# Patient Record
Sex: Male | Born: 1960 | Race: White | Hispanic: No | Marital: Married | Smoking: Never smoker
Health system: Southern US, Community
[De-identification: ages and names within clinical notes are randomized; demographics above are authoritative.]

## PROBLEM LIST (undated history)

## (undated) DIAGNOSIS — J45909 Unspecified asthma, uncomplicated: Secondary | ICD-10-CM

## (undated) DIAGNOSIS — E7800 Pure hypercholesterolemia, unspecified: Secondary | ICD-10-CM

## (undated) DIAGNOSIS — J302 Other seasonal allergic rhinitis: Secondary | ICD-10-CM

## (undated) HISTORY — DX: Pure hypercholesterolemia, unspecified: E78.00

## (undated) HISTORY — DX: Other seasonal allergic rhinitis: J30.2

## (undated) HISTORY — PX: VASECTOMY: SHX75

## (undated) HISTORY — DX: Unspecified asthma, uncomplicated: J45.909

---

## 2016-10-30 DIAGNOSIS — J309 Allergic rhinitis, unspecified: Secondary | ICD-10-CM | POA: Diagnosis not present

## 2016-10-30 DIAGNOSIS — J4540 Moderate persistent asthma, uncomplicated: Secondary | ICD-10-CM | POA: Diagnosis not present

## 2017-05-31 DIAGNOSIS — R194 Change in bowel habit: Secondary | ICD-10-CM | POA: Diagnosis not present

## 2017-05-31 DIAGNOSIS — K580 Irritable bowel syndrome with diarrhea: Secondary | ICD-10-CM | POA: Diagnosis not present

## 2017-05-31 DIAGNOSIS — R140 Abdominal distension (gaseous): Secondary | ICD-10-CM | POA: Diagnosis not present

## 2017-06-18 DIAGNOSIS — N400 Enlarged prostate without lower urinary tract symptoms: Secondary | ICD-10-CM | POA: Diagnosis not present

## 2017-06-18 DIAGNOSIS — Z8349 Family history of other endocrine, nutritional and metabolic diseases: Secondary | ICD-10-CM | POA: Diagnosis not present

## 2017-06-18 DIAGNOSIS — J309 Allergic rhinitis, unspecified: Secondary | ICD-10-CM | POA: Diagnosis not present

## 2017-06-18 DIAGNOSIS — Z8639 Personal history of other endocrine, nutritional and metabolic disease: Secondary | ICD-10-CM | POA: Diagnosis not present

## 2017-06-18 DIAGNOSIS — J4540 Moderate persistent asthma, uncomplicated: Secondary | ICD-10-CM | POA: Diagnosis not present

## 2017-06-18 DIAGNOSIS — Z0000 Encounter for general adult medical examination without abnormal findings: Secondary | ICD-10-CM | POA: Diagnosis not present

## 2017-06-18 DIAGNOSIS — Z8249 Family history of ischemic heart disease and other diseases of the circulatory system: Secondary | ICD-10-CM | POA: Diagnosis not present

## 2017-11-05 DIAGNOSIS — L821 Other seborrheic keratosis: Secondary | ICD-10-CM | POA: Diagnosis not present

## 2017-11-05 DIAGNOSIS — D1801 Hemangioma of skin and subcutaneous tissue: Secondary | ICD-10-CM | POA: Diagnosis not present

## 2017-11-05 DIAGNOSIS — D225 Melanocytic nevi of trunk: Secondary | ICD-10-CM | POA: Diagnosis not present

## 2018-04-16 DIAGNOSIS — K580 Irritable bowel syndrome with diarrhea: Secondary | ICD-10-CM | POA: Diagnosis not present

## 2018-04-16 DIAGNOSIS — R140 Abdominal distension (gaseous): Secondary | ICD-10-CM | POA: Diagnosis not present

## 2018-04-25 DIAGNOSIS — I10 Essential (primary) hypertension: Secondary | ICD-10-CM | POA: Diagnosis not present

## 2018-06-20 DIAGNOSIS — E785 Hyperlipidemia, unspecified: Secondary | ICD-10-CM | POA: Diagnosis not present

## 2018-06-20 DIAGNOSIS — M791 Myalgia, unspecified site: Secondary | ICD-10-CM | POA: Diagnosis not present

## 2018-06-20 DIAGNOSIS — Z0000 Encounter for general adult medical examination without abnormal findings: Secondary | ICD-10-CM | POA: Diagnosis not present

## 2018-07-11 ENCOUNTER — Other Ambulatory Visit: Payer: Self-pay | Admitting: Family Medicine

## 2018-07-11 DIAGNOSIS — M79604 Pain in right leg: Secondary | ICD-10-CM

## 2018-07-11 DIAGNOSIS — M79605 Pain in left leg: Secondary | ICD-10-CM

## 2018-07-18 ENCOUNTER — Ambulatory Visit
Admission: RE | Admit: 2018-07-18 | Discharge: 2018-07-18 | Disposition: A | Discharge status: Home / Self Care | Payer: 59 | Source: Ambulatory Visit | Attending: Family Medicine | Admitting: Family Medicine

## 2018-07-18 DIAGNOSIS — I70213 Atherosclerosis of native arteries of extremities with intermittent claudication, bilateral legs: Secondary | ICD-10-CM | POA: Diagnosis not present

## 2018-07-18 DIAGNOSIS — M79605 Pain in left leg: Secondary | ICD-10-CM

## 2018-07-18 DIAGNOSIS — M79604 Pain in right leg: Secondary | ICD-10-CM

## 2018-07-31 DIAGNOSIS — M9905 Segmental and somatic dysfunction of pelvic region: Secondary | ICD-10-CM | POA: Diagnosis not present

## 2018-07-31 DIAGNOSIS — M9902 Segmental and somatic dysfunction of thoracic region: Secondary | ICD-10-CM | POA: Diagnosis not present

## 2018-07-31 DIAGNOSIS — M9903 Segmental and somatic dysfunction of lumbar region: Secondary | ICD-10-CM | POA: Diagnosis not present

## 2018-08-08 DIAGNOSIS — M9905 Segmental and somatic dysfunction of pelvic region: Secondary | ICD-10-CM | POA: Diagnosis not present

## 2018-08-08 DIAGNOSIS — M9902 Segmental and somatic dysfunction of thoracic region: Secondary | ICD-10-CM | POA: Diagnosis not present

## 2018-08-08 DIAGNOSIS — M9903 Segmental and somatic dysfunction of lumbar region: Secondary | ICD-10-CM | POA: Diagnosis not present

## 2018-08-15 DIAGNOSIS — M9905 Segmental and somatic dysfunction of pelvic region: Secondary | ICD-10-CM | POA: Diagnosis not present

## 2018-08-15 DIAGNOSIS — M9903 Segmental and somatic dysfunction of lumbar region: Secondary | ICD-10-CM | POA: Diagnosis not present

## 2018-08-15 DIAGNOSIS — M9902 Segmental and somatic dysfunction of thoracic region: Secondary | ICD-10-CM | POA: Diagnosis not present

## 2018-09-11 ENCOUNTER — Other Ambulatory Visit: Payer: Self-pay | Admitting: Family Medicine

## 2018-09-11 DIAGNOSIS — M545 Low back pain, unspecified: Secondary | ICD-10-CM

## 2018-09-11 DIAGNOSIS — M79604 Pain in right leg: Secondary | ICD-10-CM

## 2018-09-11 DIAGNOSIS — M79605 Pain in left leg: Secondary | ICD-10-CM

## 2018-09-17 ENCOUNTER — Ambulatory Visit
Admission: RE | Admit: 2018-09-17 | Discharge: 2018-09-17 | Disposition: A | Discharge status: Home / Self Care | Payer: 59 | Source: Ambulatory Visit | Attending: Family Medicine | Admitting: Family Medicine

## 2018-09-17 DIAGNOSIS — M79605 Pain in left leg: Secondary | ICD-10-CM

## 2018-09-17 DIAGNOSIS — M79604 Pain in right leg: Secondary | ICD-10-CM

## 2018-09-17 DIAGNOSIS — M545 Low back pain, unspecified: Secondary | ICD-10-CM

## 2018-09-17 DIAGNOSIS — M48061 Spinal stenosis, lumbar region without neurogenic claudication: Secondary | ICD-10-CM | POA: Diagnosis not present

## 2018-09-30 DIAGNOSIS — Z6826 Body mass index (BMI) 26.0-26.9, adult: Secondary | ICD-10-CM | POA: Diagnosis not present

## 2018-09-30 DIAGNOSIS — M48061 Spinal stenosis, lumbar region without neurogenic claudication: Secondary | ICD-10-CM | POA: Diagnosis not present

## 2019-07-03 ENCOUNTER — Encounter: Payer: Self-pay | Admitting: Rheumatology

## 2019-12-10 NOTE — Progress Notes (Signed)
Office Visit Note  Patient: Joseph Davila             Date of Birth: 1961-02-01           MRN: [id number]             PCP: Kristen Loader, FNP Referring: Kristen Loader, FNP Visit Date: 12/12/2019 Occupation: @GUAROCC @  Subjective:  Pain in lower back  History of Present Illness: Zohaib Heeney is a 59 y.o. male seen in consultation per request of Dr. Collene Mares.  According to patient in 2017 he developed a Giardia infection and was treated with antibiotics.  Although the diarrhea never resolved.  He was seen by Dr. Collene Mares and had colonoscopy.  She also treated him with different antibiotics.  He was eventually diagnosed with irritable bowel syndrome.  He has had diarrhea for last many years.  He states he usually does train and hikes.  In 2018 he was training and climbed [place].  In the fall 2018 till July 2019 he did heavy training for the .  He states he felt stronger and did a steep climb with 65 pound pack on his back.  He states he had to quit in the middle as he felt weaker and had worsening of his GI symptoms.  In 2020 he took it easy and recovered.  He also had improvement in his GI symptoms with increased fiber intake.  Since 2019 he has been experiencing increased lower back pain and calf pain and lower extremity pain.  He states he had MRI of his lumbar spine which showed some changes and he was seen by neurosurgeon who evaluated him and explained to him that he does not require surgery and nothing else will be helpful.  He states he continues to have lower back pain and lower extremity pain.  He states he can walk for few miles but after that it is difficult for him.  He starts having discomfort in his bilateral gluteal region and his bilateral hamstrings.  He occasionally experiences some paresthesias.  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  He states prolonged sitting also causes increased lower back pain.  He has got a chair now with a good lumbar support.  He has been also  doing some stretching exercises which has been helpful.  He still has to take 3 tablets of Advil every morning and at night.  None of the other joints are painful.  He states he has some stiffness in his knee joints at times.  He denies any history of joint swelling.  Activities of Daily Living:  Patient reports morning stiffness for 30 minutes.   Patient Denies nocturnal pain.  Difficulty dressing/grooming: Reports Difficulty climbing stairs: Denies Difficulty getting out of chair: Denies Difficulty using hands for taps, buttons, cutlery, and/or writing: Denies  Review of Systems  Constitutional: Positive for fatigue. Negative for night sweats.  HENT: Negative for mouth sores, mouth dryness and nose dryness.   Eyes: Negative for redness and dryness.  Respiratory: Negative for shortness of breath and difficulty breathing.   Cardiovascular: Negative for chest pain, palpitations, hypertension, irregular heartbeat and swelling in legs/feet.  Gastrointestinal: Positive for diarrhea. Negative for constipation.  Endocrine: Negative for excessive thirst and increased urination.  Genitourinary: Negative for difficulty urinating.  Musculoskeletal: Positive for arthralgias, joint pain and morning stiffness. Negative for joint swelling, myalgias, muscle weakness, muscle tenderness and myalgias.  Skin: Negative for color change, rash, hair loss, nodules/bumps, skin  tightness, ulcers and sensitivity to sunlight.  Allergic/Immunologic: Negative for susceptible to infections.  Neurological: Positive for numbness and weakness. Negative for dizziness, fainting, memory loss and night sweats.  Hematological: Negative for bruising/bleeding tendency and swollen glands.  Psychiatric/Behavioral: Negative for depressed mood and sleep disturbance. The patient is not nervous/anxious.     PMFS History:  There are no problems to display for this patient.   Past Medical History:  Diagnosis Date  . Asthma   .  High cholesterol   . Seasonal allergies     Family History  Problem Relation Age of Onset  . Dementia Mother   . Cancer Father    Past Surgical History:  Procedure Laterality Date  . VASECTOMY     Social History   Social History Narrative  . Not on file    There is no immunization history on file for this patient.   Objective: Vital Signs: BP (!) 141/93 (BP Location: Right Arm, Patient Position: Sitting, Cuff Size: Normal)   Pulse 74   Resp 14   Ht 5' 10"  (1.778 m)   Wt 188 lb (85.3 kg)   BMI 26.98 kg/m    Physical Exam Vitals and nursing note reviewed.  Constitutional:      Appearance: He is well-developed.  HENT:     Head: Normocephalic and atraumatic.  Eyes:     Conjunctiva/sclera: Conjunctivae normal.     Pupils: Pupils are equal, round, and reactive to light.  Cardiovascular:     Rate and Rhythm: Normal rate and regular rhythm.     Heart sounds: Normal heart sounds.  Pulmonary:     Effort: Pulmonary effort is normal.     Breath sounds: Normal breath sounds.  Abdominal:     General: Bowel sounds are normal.     Palpations: Abdomen is soft.  Musculoskeletal:     Cervical back: Normal range of motion and neck supple.  Skin:    General: Skin is warm and dry.     Capillary Refill: Capillary refill takes less than 2 seconds.  Neurological:     Mental Status: He is alert and oriented to person, place, and time.  Psychiatric:        Behavior: Behavior normal.      Musculoskeletal Exam: C-spine was in full range of motion.  Thoracic and lumbar spine were in full range of motion.  He had no point tenderness over the lumbar region.  No SI joint tenderness was noted.  Shoulder joints, elbow joints, wrist joints with good range of motion.  He had mild PIP and DIP thickening.  Hip joints, knee joints, ankles, MTPs and PIPs with good range of motion.  He had no plantar fasciitis or Achilles tendinitis.  CDAI Exam: CDAI Score: -- Patient Global: --; Provider  Global: -- Swollen: --; Tender: -- Joint Exam 12/12/2019   No joint exam has been documented for this visit   There is currently no information documented on the homunculus. Go to the Rheumatology activity and complete the homunculus joint exam.  Investigation: No additional findings.  Imaging: No results found.  Recent Labs: No results found for: WBC, HGB, PLT, NA, K, CL, CO2, GLUCOSE, BUN, CREATININE, BILITOT, ALKPHOS, AST, ALT, PROT, ALBUMIN, CALCIUM, GFRAA, QFTBGOLD, QFTBGOLDPLUS   July 03, 2019 vitamin D 41.1 CPK 262 (52-200) RF 25.1 (0-13.9), ANA negative, CRP negative, ESR 8, CBC normal, LDL 140, CMP normal, PSA normal June 21, 2018 RF 44  IMPRESSION: Small left-sided disc protrusion L3-4  Grade 1  anterolisthesis L4-5 with disc and facet degeneration causing moderate spinal stenosis. Broad-based left-sided disc protrusion. Moderate subarticular foraminal stenosis bilaterally  Central and right-sided disc protrusion L5-S1 with expected impingement right S1 nerve root.   Electronically Signed   By: Franchot Gallo M.D.   On: 09/18/2018 09:10  Speciality Comments: No specialty comments available.  Procedures:  No procedures performed Allergies: Patient has no allergy information on record.   Assessment / Plan:     Visit Diagnoses: Spinal stenosis of lumbar region with neurogenic claudication -his pain is mostly in his lumbar spine which radiates into his bilateral lower extremities mostly in the gluteal and hamstrings.  I reviewed MRI findings with him at length.  He has multilevel disc disease, anterolisthesis and moderate to spinal stenosis.  Core muscle strengthening exercises were demonstrated in the office and handout was given.  He feels benefit from epidural injection to relieve some of the discomfort.  We had detailed discussion regarding it.  He was in agreement.  I will refer him to Dr. Ernestina Patches for evaluation and treatment.  We also discussed natural  anti-inflammatories instead of Advil as he has GI symptoms.  Plan: Ambulatory referral to Physical Medicine Rehab  Rheumatoid factor positive-he has positive rheumatoid factor although he does not have any synovitis on examination.  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  Giardiasis - 2017.  Patient was treated with antibiotics at the time and also at a later date.  He continues to have some chronic intermittent diarrhea which may be due to underlying IBS.  He has no clinical features of reactive arthritis.  History of IBS-followed and treated by Dr. Collene Mares.  Dyslipidemia-he is on WelChol.  History of asthma  Seasonal allergies  Orders: Orders Placed This Encounter  Procedures  . Ambulatory referral to Physical Medicine Rehab   No orders of the defined types were placed in this encounter.   Face-to-face time spent with patient was 60 minutes. Greater than 50% of time was spent in counseling and coordination of care.  Follow-Up Instructions: Return in about 6 months (around 06/13/2020).   Bo Merino, MD  Note - This record has been created using Editor, commissioning.  Chart creation errors have been sought, but may not always  have been located. Such creation errors do not reflect on  the standard of medical care.

## 2019-12-11 ENCOUNTER — Telehealth: Payer: Self-pay | Admitting: Rheumatology

## 2019-12-11 NOTE — Telephone Encounter (Signed)
Patient called stating he was returning your call.  Patient states he will email his labwork results this afternoon to the email you provided.  Patient requested a return call to let him know you were able to print them out for Dr. Estanislado Pandy.

## 2019-12-12 ENCOUNTER — Ambulatory Visit (INDEPENDENT_AMBULATORY_CARE_PROVIDER_SITE_OTHER): Payer: 59 | Admitting: Rheumatology

## 2019-12-12 ENCOUNTER — Encounter: Payer: Self-pay | Admitting: Rheumatology

## 2019-12-12 ENCOUNTER — Other Ambulatory Visit: Payer: Self-pay

## 2019-12-12 VITALS — BP 141/93 | HR 74 | Resp 14 | Ht 70.0 in | Wt 188.0 lb

## 2019-12-12 DIAGNOSIS — R768 Other specified abnormal immunological findings in serum: Secondary | ICD-10-CM

## 2019-12-12 DIAGNOSIS — M48062 Spinal stenosis, lumbar region with neurogenic claudication: Secondary | ICD-10-CM | POA: Diagnosis not present

## 2019-12-12 DIAGNOSIS — A071 Giardiasis [lambliasis]: Secondary | ICD-10-CM | POA: Diagnosis not present

## 2019-12-12 DIAGNOSIS — E785 Hyperlipidemia, unspecified: Secondary | ICD-10-CM

## 2019-12-12 DIAGNOSIS — J302 Other seasonal allergic rhinitis: Secondary | ICD-10-CM

## 2019-12-12 DIAGNOSIS — Z8709 Personal history of other diseases of the respiratory system: Secondary | ICD-10-CM | POA: Insufficient documentation

## 2019-12-12 DIAGNOSIS — Z8719 Personal history of other diseases of the digestive system: Secondary | ICD-10-CM

## 2019-12-12 NOTE — Telephone Encounter (Signed)
Patient will bring labs at appointment

## 2019-12-12 NOTE — Patient Instructions (Signed)

## 2020-01-22 ENCOUNTER — Encounter: Payer: Self-pay | Admitting: Physical Medicine and Rehabilitation

## 2020-01-22 ENCOUNTER — Other Ambulatory Visit: Payer: Self-pay

## 2020-01-22 ENCOUNTER — Ambulatory Visit (INDEPENDENT_AMBULATORY_CARE_PROVIDER_SITE_OTHER): Payer: 59 | Admitting: Physical Medicine and Rehabilitation

## 2020-01-22 ENCOUNTER — Ambulatory Visit: Payer: Self-pay

## 2020-01-22 VITALS — BP 160/100 | HR 75

## 2020-01-22 DIAGNOSIS — M48061 Spinal stenosis, lumbar region without neurogenic claudication: Secondary | ICD-10-CM | POA: Diagnosis not present

## 2020-01-22 DIAGNOSIS — M48062 Spinal stenosis, lumbar region with neurogenic claudication: Secondary | ICD-10-CM | POA: Diagnosis not present

## 2020-01-22 DIAGNOSIS — M4316 Spondylolisthesis, lumbar region: Secondary | ICD-10-CM

## 2020-01-22 DIAGNOSIS — M47816 Spondylosis without myelopathy or radiculopathy, lumbar region: Secondary | ICD-10-CM | POA: Diagnosis not present

## 2020-01-22 MED ORDER — METHYLPREDNISOLONE ACETATE 80 MG/ML IJ SUSP
40.0000 mg | Freq: Once | INTRAMUSCULAR | Status: AC
  Administered 2020-01-22: 40 mg

## 2020-01-22 NOTE — Progress Notes (Signed)
Numeric Pain Rating Scale and Functional Assessment Average Pain (7) Pain Right Now (2)-took advil this morning My pain is down both legs Pain is worse with: sitting and stress Pain improves with: movement, advil and [place] HEP   In the last MONTH (on 0-10 scale) has pain interfered with the following?  1. General activity like being  able to carry out your everyday physical activities such as walking, climbing stairs, carrying groceries, or moving a chair?  Rating(6)  2. Relation with others like being able to carry out your usual social activities and roles such as  activities at home, at work and in your community. Rating(5)  3. Enjoyment of life such that you have  been bothered by emotional problems such as feeling anxious, depressed or irritable?  Rating(5)

## 2020-02-06 ENCOUNTER — Telehealth: Payer: Self-pay | Admitting: Physical Medicine and Rehabilitation

## 2020-02-06 NOTE — Telephone Encounter (Signed)
Patient had bilateral L4 TF on 4/22. He reports that he did have good relief for a little over a week, but now the pain is returning. He states that he did go several days without having to take anything for pain, but now by the end of the day is is really hurting. He also asked if he should be taking ibuprofen more regularly rather than waiting for the pain to intensify. Please advise.

## 2020-02-06 NOTE — Telephone Encounter (Signed)
Scheduled for 6/3 with driver.

## 2020-02-06 NOTE — Telephone Encounter (Signed)
Repeat injection and can use 600 to 800 mg Ibuprofen TID for short periods. SO, take ibuprofen regularly and get second shot and take ibuprofen for like two weeks then hopefull will just be as needed.

## 2020-03-04 ENCOUNTER — Encounter: Payer: Self-pay | Admitting: Physical Medicine and Rehabilitation

## 2020-03-04 ENCOUNTER — Ambulatory Visit: Payer: Self-pay

## 2020-03-04 ENCOUNTER — Ambulatory Visit (INDEPENDENT_AMBULATORY_CARE_PROVIDER_SITE_OTHER): Payer: 59 | Admitting: Physical Medicine and Rehabilitation

## 2020-03-04 ENCOUNTER — Other Ambulatory Visit: Payer: Self-pay

## 2020-03-04 VITALS — BP 163/108 | HR 62

## 2020-03-04 DIAGNOSIS — M48062 Spinal stenosis, lumbar region with neurogenic claudication: Secondary | ICD-10-CM | POA: Diagnosis not present

## 2020-03-04 DIAGNOSIS — G8929 Other chronic pain: Secondary | ICD-10-CM

## 2020-03-04 DIAGNOSIS — M5416 Radiculopathy, lumbar region: Secondary | ICD-10-CM

## 2020-03-04 MED ORDER — METHYLPREDNISOLONE ACETATE 80 MG/ML IJ SUSP
80.0000 mg | Freq: Once | INTRAMUSCULAR | Status: AC
  Administered 2020-03-04: 80 mg

## 2020-03-04 NOTE — Progress Notes (Signed)
Pt states pain across the lower back that radiates into the back of both legs all the way down. Pt states last injection 01/28/20 helped out a lot. Not being mobile makes pain worse. Moving around and advil helps with pain.   .Numeric Pain Rating Scale and Functional Assessment Average Pain 6   In the last MONTH (on 0-10 scale) has pain interfered with the following?  1. General activity like being  able to carry out your everyday physical activities such as walking, climbing stairs, carrying groceries, or moving a chair?  Rating(7)   +Driver, -BT, -Dye Allergies.

## 2020-03-04 NOTE — Progress Notes (Signed)
Joseph Davila - 59 y.o. male MRN [id number]  Date of birth: 1960-10-19  Office Visit Note: Visit Date: 03/04/2020 PCP: Kristen Loader, FNP Referred by: Kristen Loader, FNP  Subjective: Chief Complaint  Patient presents with  . Lower Back - Pain  . Right Leg - Pain  . Left Leg - Pain   HPI:  Joseph Davila is a 59 y.o. male who comes in today for planned repeat bilateral L4-L5 lumbar transforaminal epidural steroid injection with fluoroscopic guidance.  The patient has failed conservative care including home exercise, medications, time and activity modification.  This injection will be diagnostic and hopefully therapeutic.  Please see requesting physician notes for further details and justification.  MRI reviewed with images and spine model.  MRI reviewed in the note below.  Patient received more than 50% pain relief from prior injection.  After the injection the patient had approximately 2 to 3 weeks of almost 100% pain relief without even taking any ibuprofen.  He did call and at that point was began having some symptoms back into the lower back worse with activity gets in a position where it does start to radiate down both legs and more of an L5 distribution.  No real tingling paresthesias.  He reports still being able to do more after the injections overall he has gotten sustained relief just not where he wants to be.  Case is complicated by positive rheumatoid factor and history of irritable bowel syndrome.  He continues to stay very active and we did talk about core strengthening and exercises.  Has been doing some McKenzie type exercises which would be fine except he does have some level of facet arthritis and that can sometimes irritate the lower spine.  I have suggested he look up books and videos of Tenna Child, PhD.    ROS Otherwise per HPI.  Assessment & Plan: Visit Diagnoses:  1. Spinal stenosis of lumbar region with neurogenic claudication   2. Chronic bilateral low back  pain with bilateral sciatica   3. Lumbar radiculopathy     Plan: Findings:  See HPI    Meds & Orders:  Meds ordered this encounter  Medications  . methylPREDNISolone acetate (DEPO-MEDROL) injection 80 mg    Orders Placed This Encounter  Procedures  . XR C-ARM NO REPORT  . Epidural Steroid injection    Follow-up: Return if symptoms worsen or fail to improve.   Procedures: No procedures performed  Lumbosacral Transforaminal Epidural Steroid Injection - Sub-Pedicular Approach with Fluoroscopic Guidance  Patient: Joseph Davila      Date of Birth: 02/23/1961 MRN: [id number] PCP: Kristen Loader, FNP      Visit Date: 03/04/2020   Universal Protocol:    Date/Time: 03/04/2020  Consent Given By: the patient  Position: PRONE  Additional Comments: Vital signs were monitored before and after the procedure. Patient was prepped and draped in the usual sterile fashion. The correct patient, procedure, and site was verified.   Injection Procedure Details:  Procedure Site One Meds Administered:  Meds ordered this encounter  Medications  . methylPREDNISolone acetate (DEPO-MEDROL) injection 80 mg    Laterality: Bilateral  Location/Site:  L4-L5  Needle size: 22 G  Needle type: Spinal  Needle Placement: Transforaminal  Findings:    -Comments: Excellent flow of contrast along the nerve and into the epidural space.  Procedure Details: After squaring off the end-plates to get a true AP view, the C-arm was positioned so that an oblique view of the foramen  as noted above was visualized. The target area is just inferior to the "nose of the scotty dog" or sub pedicular. The soft tissues overlying this structure were infiltrated with 2-3 ml. of 1% Lidocaine without Epinephrine.  The spinal needle was inserted toward the target using a "trajectory" view along the fluoroscope beam.  Under AP and lateral visualization, the needle was advanced so it did not puncture dura and was  located close the 6 O'Clock position of the pedical in AP tracterory. Biplanar projections were used to confirm position. Aspiration was confirmed to be negative for CSF and/or blood. A 1-2 ml. volume of Isovue-250 was injected and flow of contrast was noted at each level. Radiographs were obtained for documentation purposes.   After attaining the desired flow of contrast documented above, a 0.5 to 1.0 ml test dose of 0.25% Marcaine was injected into each respective transforaminal space.  The patient was observed for 90 seconds post injection.  After no sensory deficits were reported, and normal lower extremity motor function was noted,   the above injectate was administered so that equal amounts of the injectate were placed at each foramen (level) into the transforaminal epidural space.   Additional Comments:  The patient tolerated the procedure well Dressing: 2 x 2 sterile gauze and Band-Aid    Post-procedure details: Patient was observed during the procedure. Post-procedure instructions were reviewed.  Patient left the clinic in stable condition.      Clinical History: MRI LUMBAR SPINE WITHOUT CONTRAST  TECHNIQUE: Multiplanar, multisequence MR imaging of the lumbar spine was performed. No intravenous contrast was administered.  COMPARISON:  None.  FINDINGS: Segmentation:  Normal  Alignment:  Mild retrolisthesis L3-4.  Mild anterolisthesis L4-5.  Vertebrae:  Normal bone marrow.  Negative for fracture or mass.  Conus medullaris and cauda equina: Conus extends to the L1-2 level. Conus and cauda equina appear normal.  Paraspinal and other soft tissues: Negative for paraspinous mass or fluid collection  Disc levels:  L1-2: Mild disc bulging.  Negative for stenosis  L2-3: Mild disc bulging and mild facet degeneration. Negative for stenosis.  L3-4: Small disc protrusion on the left involving the foramen and extraforaminal space. Mild subarticular stenosis. No  significant spinal stenosis  L4-5: Mild anterolisthesis. Broad-based left-sided disc protrusion with subarticular stenosis. Moderate subarticular and foraminal stenosis bilaterally. Moderate spinal stenosis. Moderate facet degeneration bilaterally  L5-S1: Small central and right-sided disc protrusion. Expected impingement of the right S1 nerve root. Bilateral facet hypertrophy.  IMPRESSION: Small left-sided disc protrusion L3-4  Grade 1 anterolisthesis L4-5 with disc and facet degeneration causing moderate spinal stenosis. Broad-based left-sided disc protrusion. Moderate subarticular foraminal stenosis bilaterally  Central and right-sided disc protrusion L5-S1 with expected impingement right S1 nerve root.   Electronically Signed   By: Franchot Gallo M.D.   On: 09/18/2018 09:10     Objective:  VS:  HT:    WT:   BMI:     BP:(!) 163/108  HR:62bpm  TEMP: ( )  RESP:  Physical Exam Constitutional:      General: He is not in acute distress.    Appearance: Normal appearance. He is not ill-appearing.  HENT:     Head: Normocephalic and atraumatic.     Right Ear: External ear normal.     Left Ear: External ear normal.  Eyes:     Extraocular Movements: Extraocular movements intact.  Cardiovascular:     Rate and Rhythm: Normal rate.     Pulses: Normal pulses.  Abdominal:  General: There is no distension.     Palpations: Abdomen is soft.  Musculoskeletal:        General: No tenderness or signs of injury.     Right lower leg: No edema.     Left lower leg: No edema.     Comments: Patient has good distal strength without clonus.  Skin:    Findings: No erythema or rash.  Neurological:     General: No focal deficit present.     Mental Status: He is alert and oriented to person, place, and time.     Sensory: No sensory deficit.     Motor: No weakness or abnormal muscle tone.     Coordination: Coordination normal.  Psychiatric:        Mood and Affect: Mood  normal.        Behavior: Behavior normal.      Imaging: XR C-ARM NO REPORT  Result Date: 03/04/2020 Please see Notes tab for imaging impression.

## 2020-03-04 NOTE — Progress Notes (Signed)
Joseph Davila - 59 y.o. male MRN [id number]  Date of birth: Jul 28, 1961  Office Visit Note: Visit Date: 01/22/2020 PCP: Kristen Loader, FNP Referred by: Kristen Loader, FNP  Subjective: Chief Complaint  Patient presents with  . Lower Back - Pain   HPI: Joseph Davila is a 59 y.o. male who comes in today As a new patient evaluation and management for chronic worsening low back pain and bilateral leg pain at the request of Dr. Cy Blamer.  Patient's history today provided by the patient as well as Dr. Arlean Hopping notes and his primary care provider Ina Homes, [place].  He reports a history of pain really beginning in 2019 without specific injury.  He reports just progressive worsening back pain that on long walks will start to refer into the buttock region much in the way of a neurogenic claudication.  Eventually will start to make its way down into the legs and calves and more of a posterior lateral position.  Not much in the way of paresthesia but may be on occasion.  He said no focal weakness no bowel or bladder difficulty or any red flag complaints.  He has had a history of irritable bowel syndrome and chronic diarrhea.  He is a very active individual with a lot of training over the last several years for different types and different physical activity that he does.  He does lift weights and exercise.  He reports worsening pain with prolonged standing and walking but also if he sits for a long time particular trying to stand up.  Some movement in general seems to help initially.  He does take 3 ibuprofen over-the-counter capsules in the morning and evening.  He also has been doing [employer] home exercise program and has been through physical therapy in the past.  His primary care provider did obtain MRI of the lumbar spine and this is reviewed with him today and reviewed with spine models and imaging.  He actually saw a neurosurgeon who per the patient's report suggested there was really no surgical  approach at this point for him given how active and functional he was and that there was really not much else to offer.  He has not had any interventional spine procedures or injections.  Review of Systems  Musculoskeletal: Positive for back pain and joint pain.       Bilateral leg pain  All other systems reviewed and are negative.  Otherwise per HPI.  Assessment & Plan: Visit Diagnoses:  1. Spinal stenosis of lumbar region with neurogenic claudication   2. Spondylolisthesis of lumbar region   3. Spondylosis without myelopathy or radiculopathy, lumbar region   4. Bilateral stenosis of lateral recess of lumbar spine     Plan: Findings:  Chronic progressive worsening low back pain with bilateral radicular-like leg pain is consistent with a neurogenic claudication.  Exacerbation of this chronic condition over the last many months.  MRI with a grade 1 listhesis of L4 on L5 with arthritis as well as lateral recess narrowing and moderate central stenosis.  He also has a small disc protrusion on the right at L5-S1.  I think most of his pain is really coming from the lateral recess and central stenosis although he is probably getting some back pain from the facet arthritis as well.  He is very active and we did talk about [employer] exercises as well as weight lifting and strengthening and core strengthening which he does a lot.  He does have a  job where he sits a lot.  We talked about taking frequent breaks.  At this point given the amount of pain that he is having were going to go ahead and complete bilateral L4 transforaminal injection diagnostically today.  If he gets great relief these can be repeated from time to time based on severity of symptoms and prolonged length of relief.  It would also be diagnostic that it is the stenosis.  If you are getting more back pain can obviously go down the road of potential for radiofrequency ablation of the facet joints.  I agree that with his activity level and his  functional status is probably not wise to consider surgery at this point although at some point he may end up with more severe stenosis at this level.  He is going to go off of his ibuprofen when we do the injection just to see how much relief he gets without taking any medication.  He like to avoid medications in the future if possible.    Meds & Orders:  Meds ordered this encounter  Medications  . methylPREDNISolone acetate (DEPO-MEDROL) injection 40 mg    Orders Placed This Encounter  Procedures  . XR C-ARM NO REPORT  . Epidural Steroid injection    Follow-up: Return if symptoms worsen or fail to improve.   Procedures: No procedures performed  Lumbosacral Transforaminal Epidural Steroid Injection - Sub-Pedicular Approach with Fluoroscopic Guidance  Patient: Joseph Davila      Date of Birth: May 31, 1961 MRN: [id number] PCP: Kristen Loader, FNP      Visit Date: 01/22/2020   Universal Protocol:    Date/Time: 01/22/2020  Consent Given By: the patient  Position: PRONE  Additional Comments: Vital signs were monitored before and after the procedure. Patient was prepped and draped in the usual sterile fashion. The correct patient, procedure, and site was verified.   Injection Procedure Details:  Procedure Site One Meds Administered:  Meds ordered this encounter  Medications  . methylPREDNISolone acetate (DEPO-MEDROL) injection 40 mg    Laterality: Bilateral  Location/Site:  L4-L5  Needle size: 22 G  Needle type: Spinal  Needle Placement: Transforaminal  Findings:    -Comments: Excellent flow of contrast along the nerve and into the epidural space.  Procedure Details: After squaring off the end-plates to get a true AP view, the C-arm was positioned so that an oblique view of the foramen as noted above was visualized. The target area is just inferior to the "nose of the scotty dog" or sub pedicular. The soft tissues overlying this structure were infiltrated with  2-3 ml. of 1% Lidocaine without Epinephrine.  The spinal needle was inserted toward the target using a "trajectory" view along the fluoroscope beam.  Under AP and lateral visualization, the needle was advanced so it did not puncture dura and was located close the 6 O'Clock position of the pedical in AP tracterory. Biplanar projections were used to confirm position. Aspiration was confirmed to be negative for CSF and/or blood. A 1-2 ml. volume of Isovue-250 was injected and flow of contrast was noted at each level. Radiographs were obtained for documentation purposes.   After attaining the desired flow of contrast documented above, a 0.5 to 1.0 ml test dose of 0.25% Marcaine was injected into each respective transforaminal space.  The patient was observed for 90 seconds post injection.  After no sensory deficits were reported, and normal lower extremity motor function was noted,   the above injectate was administered so that equal  amounts of the injectate were placed at each foramen (level) into the transforaminal epidural space.   Additional Comments:  The patient tolerated the procedure well Dressing: 2 x 2 sterile gauze and Band-Aid    Post-procedure details: Patient was observed during the procedure. Post-procedure instructions were reviewed.  Patient left the clinic in stable condition.      Clinical History: MRI LUMBAR SPINE WITHOUT CONTRAST  TECHNIQUE: Multiplanar, multisequence MR imaging of the lumbar spine was performed. No intravenous contrast was administered.  COMPARISON:  None.  FINDINGS: Segmentation:  Normal  Alignment:  Mild retrolisthesis L3-4.  Mild anterolisthesis L4-5.  Vertebrae:  Normal bone marrow.  Negative for fracture or mass.  Conus medullaris and cauda equina: Conus extends to the L1-2 level. Conus and cauda equina appear normal.  Paraspinal and other soft tissues: Negative for paraspinous mass or fluid collection  Disc levels:  L1-2:  Mild disc bulging.  Negative for stenosis  L2-3: Mild disc bulging and mild facet degeneration. Negative for stenosis.  L3-4: Small disc protrusion on the left involving the foramen and extraforaminal space. Mild subarticular stenosis. No significant spinal stenosis  L4-5: Mild anterolisthesis. Broad-based left-sided disc protrusion with subarticular stenosis. Moderate subarticular and foraminal stenosis bilaterally. Moderate spinal stenosis. Moderate facet degeneration bilaterally  L5-S1: Small central and right-sided disc protrusion. Expected impingement of the right S1 nerve root. Bilateral facet hypertrophy.  IMPRESSION: Small left-sided disc protrusion L3-4  Grade 1 anterolisthesis L4-5 with disc and facet degeneration causing moderate spinal stenosis. Broad-based left-sided disc protrusion. Moderate subarticular foraminal stenosis bilaterally  Central and right-sided disc protrusion L5-S1 with expected impingement right S1 nerve root.   Electronically Signed   By: Franchot Gallo M.D.   On: 09/18/2018 09:10   He reports that he has never smoked. He has never used smokeless tobacco. No results for input(s): HGBA1C, LABURIC in the last 8760 hours.  Objective:  VS:  HT:    WT:   BMI:     BP:(!) 160/100  HR:75bpm  TEMP: ( )  RESP:  Physical Exam Vitals and nursing note reviewed.  Constitutional:      General: He is not in acute distress.    Appearance: Normal appearance. He is well-developed.  HENT:     Head: Normocephalic and atraumatic.  Eyes:     Conjunctiva/sclera: Conjunctivae normal.     Pupils: Pupils are equal, round, and reactive to light.  Cardiovascular:     Rate and Rhythm: Normal rate.     Pulses: Normal pulses.     Heart sounds: Normal heart sounds.  Pulmonary:     Effort: Pulmonary effort is normal. No respiratory distress.  Musculoskeletal:     Cervical back: Normal range of motion and neck supple. No rigidity.     Right lower leg:  No edema.     Left lower leg: No edema.     Comments: Patient somewhat slow to rise from seated position to full extension he does have concordant low back pain with facet loading and extension rotation.  He has no focal trigger points no tender points.  No pain over the greater trochanters no pain with hip rotation has good distal strength and no clonus.  Skin:    General: Skin is warm and dry.     Findings: No erythema or rash.  Neurological:     General: No focal deficit present.     Mental Status: He is alert and oriented to person, place, and time.     Sensory:  No sensory deficit.     Coordination: Coordination normal.     Gait: Gait normal.  Psychiatric:        Mood and Affect: Mood normal.        Behavior: Behavior normal.     Ortho Exam  Imaging: Epidural Steroid injection  Result Date: 03/04/2020 Magnus Sinning, MD     03/04/2020  9:42 PM Lumbosacral Transforaminal Epidural Steroid Injection - Sub-Pedicular Approach with Fluoroscopic Guidance Patient: Joseph Davila     Date of Birth: 1961/06/28 MRN: [id number] PCP: Kristen Loader, FNP     Visit Date: 03/04/2020  Universal Protocol:   Date/Time: 03/04/2020 Consent Given By: the patient Position: PRONE Additional Comments: Vital signs were monitored before and after the procedure. Patient was prepped and draped in the usual sterile fashion. The correct patient, procedure, and site was verified. Injection Procedure Details: Procedure Site One Meds Administered: Meds ordered this encounter Medications . methylPREDNISolone acetate (DEPO-MEDROL) injection 80 mg Laterality: Bilateral Location/Site: L4-L5 Needle size: 22 G Needle type: Spinal Needle Placement: Transforaminal Findings:   -Comments: Excellent flow of contrast along the nerve and into the epidural space. Procedure Details: After squaring off the end-plates to get a true AP view, the C-arm was positioned so that an oblique view of the foramen as noted above was visualized. The  target area is just inferior to the "nose of the scotty dog" or sub pedicular. The soft tissues overlying this structure were infiltrated with 2-3 ml. of 1% Lidocaine without Epinephrine. The spinal needle was inserted toward the target using a "trajectory" view along the fluoroscope beam.  Under AP and lateral visualization, the needle was advanced so it did not puncture dura and was located close the 6 O'Clock position of the pedical in AP tracterory. Biplanar projections were used to confirm position. Aspiration was confirmed to be negative for CSF and/or blood. A 1-2 ml. volume of Isovue-250 was injected and flow of contrast was noted at each level. Radiographs were obtained for documentation purposes. After attaining the desired flow of contrast documented above, a 0.5 to 1.0 ml test dose of 0.25% Marcaine was injected into each respective transforaminal space.  The patient was observed for 90 seconds post injection.  After no sensory deficits were reported, and normal lower extremity motor function was noted,   the above injectate was administered so that equal amounts of the injectate were placed at each foramen (level) into the transforaminal epidural space. Additional Comments: The patient tolerated the procedure well Dressing: 2 x 2 sterile gauze and Band-Aid  Post-procedure details: Patient was observed during the procedure. Post-procedure instructions were reviewed. Patient left the clinic in stable condition.   XR C-ARM NO REPORT  Result Date: 03/04/2020 Please see Notes tab for imaging impression.   Past Medical/Family/Surgical/Social History: Medications & Allergies reviewed per EMR, new medications updated. Patient Active Problem List   Diagnosis Date Noted  . Spinal stenosis of lumbar region with neurogenic claudication 12/12/2019  . Rheumatoid factor positive 12/12/2019  . Giardiasis 12/12/2019  . History of IBS 12/12/2019  . History of asthma 12/12/2019  . Seasonal allergies  12/12/2019  . Dyslipidemia 12/12/2019   Past Medical History:  Diagnosis Date  . Asthma   . High cholesterol   . Seasonal allergies    Family History  Problem Relation Age of Onset  . Dementia Mother   . Cancer Father    Past Surgical History:  Procedure Laterality Date  . VASECTOMY     Social History  Occupational History  . Not on file  Tobacco Use  . Smoking status: Never Smoker  . Smokeless tobacco: Never Used  Substance and Sexual Activity  . Alcohol use: Yes    Alcohol/week: 4.0 standard drinks    Types: 4 Cans of beer per week  . Drug use: Never  . Sexual activity: Not on file

## 2020-03-04 NOTE — Procedures (Signed)
Lumbosacral Transforaminal Epidural Steroid Injection - Sub-Pedicular Approach with Fluoroscopic Guidance  Patient: Joseph Davila      Date of Birth: 1961-06-05 MRN: [id number] PCP: Kristen Loader, FNP      Visit Date: 03/04/2020   Universal Protocol:    Date/Time: 03/04/2020  Consent Given By: the patient  Position: PRONE  Additional Comments: Vital signs were monitored before and after the procedure. Patient was prepped and draped in the usual sterile fashion. The correct patient, procedure, and site was verified.   Injection Procedure Details:  Procedure Site One Meds Administered:  Meds ordered this encounter  Medications  . methylPREDNISolone acetate (DEPO-MEDROL) injection 80 mg    Laterality: Bilateral  Location/Site:  L4-L5  Needle size: 22 G  Needle type: Spinal  Needle Placement: Transforaminal  Findings:    -Comments: Excellent flow of contrast along the nerve and into the epidural space.  Procedure Details: After squaring off the end-plates to get a true AP view, the C-arm was positioned so that an oblique view of the foramen as noted above was visualized. The target area is just inferior to the "nose of the scotty dog" or sub pedicular. The soft tissues overlying this structure were infiltrated with 2-3 ml. of 1% Lidocaine without Epinephrine.  The spinal needle was inserted toward the target using a "trajectory" view along the fluoroscope beam.  Under AP and lateral visualization, the needle was advanced so it did not puncture dura and was located close the 6 O'Clock position of the pedical in AP tracterory. Biplanar projections were used to confirm position. Aspiration was confirmed to be negative for CSF and/or blood. A 1-2 ml. volume of Isovue-250 was injected and flow of contrast was noted at each level. Radiographs were obtained for documentation purposes.   After attaining the desired flow of contrast documented above, a 0.5 to 1.0 ml test dose of  0.25% Marcaine was injected into each respective transforaminal space.  The patient was observed for 90 seconds post injection.  After no sensory deficits were reported, and normal lower extremity motor function was noted,   the above injectate was administered so that equal amounts of the injectate were placed at each foramen (level) into the transforaminal epidural space.   Additional Comments:  The patient tolerated the procedure well Dressing: 2 x 2 sterile gauze and Band-Aid    Post-procedure details: Patient was observed during the procedure. Post-procedure instructions were reviewed.  Patient left the clinic in stable condition.

## 2020-03-04 NOTE — Procedures (Signed)
Lumbosacral Transforaminal Epidural Steroid Injection - Sub-Pedicular Approach with Fluoroscopic Guidance  Patient: Joseph Davila      Date of Birth: 07-Mar-1961 MRN: [id number] PCP: Kristen Loader, FNP      Visit Date: 01/22/2020   Universal Protocol:    Date/Time: 01/22/2020  Consent Given By: the patient  Position: PRONE  Additional Comments: Vital signs were monitored before and after the procedure. Patient was prepped and draped in the usual sterile fashion. The correct patient, procedure, and site was verified.   Injection Procedure Details:  Procedure Site One Meds Administered:  Meds ordered this encounter  Medications  . methylPREDNISolone acetate (DEPO-MEDROL) injection 40 mg    Laterality: Bilateral  Location/Site:  L4-L5  Needle size: 22 G  Needle type: Spinal  Needle Placement: Transforaminal  Findings:    -Comments: Excellent flow of contrast along the nerve and into the epidural space.  Procedure Details: After squaring off the end-plates to get a true AP view, the C-arm was positioned so that an oblique view of the foramen as noted above was visualized. The target area is just inferior to the "nose of the scotty dog" or sub pedicular. The soft tissues overlying this structure were infiltrated with 2-3 ml. of 1% Lidocaine without Epinephrine.  The spinal needle was inserted toward the target using a "trajectory" view along the fluoroscope beam.  Under AP and lateral visualization, the needle was advanced so it did not puncture dura and was located close the 6 O'Clock position of the pedical in AP tracterory. Biplanar projections were used to confirm position. Aspiration was confirmed to be negative for CSF and/or blood. A 1-2 ml. volume of Isovue-250 was injected and flow of contrast was noted at each level. Radiographs were obtained for documentation purposes.   After attaining the desired flow of contrast documented above, a 0.5 to 1.0 ml test dose of  0.25% Marcaine was injected into each respective transforaminal space.  The patient was observed for 90 seconds post injection.  After no sensory deficits were reported, and normal lower extremity motor function was noted,   the above injectate was administered so that equal amounts of the injectate were placed at each foramen (level) into the transforaminal epidural space.   Additional Comments:  The patient tolerated the procedure well Dressing: 2 x 2 sterile gauze and Band-Aid    Post-procedure details: Patient was observed during the procedure. Post-procedure instructions were reviewed.  Patient left the clinic in stable condition.

## 2020-03-22 IMAGING — MR MR LUMBAR SPINE W/O CM
4 of 5 series · 27 of 48 positions shown · non-contrast
Comparison: None.

CLINICAL DATA: Low back pain with bilateral leg pain

EXAM:
MRI LUMBAR SPINE WITHOUT CONTRAST
TECHNIQUE: Multiplanar, multisequence MR imaging of the lumbar spine was
performed. No intravenous contrast was administered.

[Series 3: T2 · sagittal · 4.0mm · 0.55mm/px · 5 of 14 slices shown (1 of 2)]
[im 1/14]
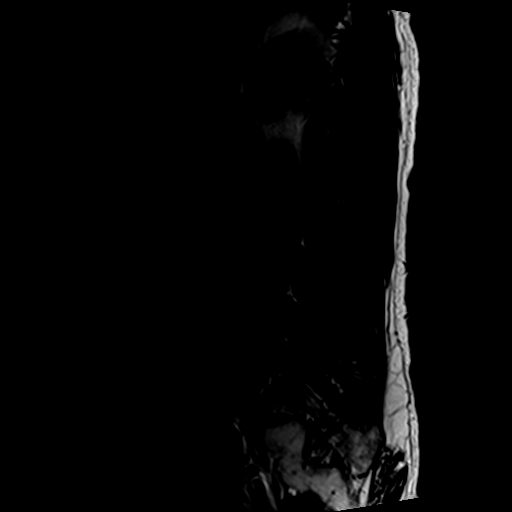
[im 4/14]
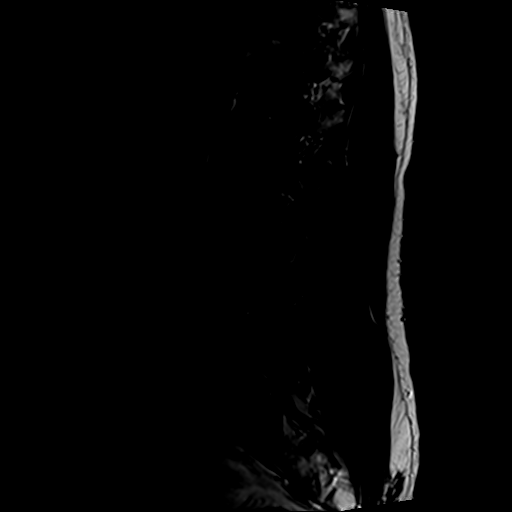
[im 7/14]
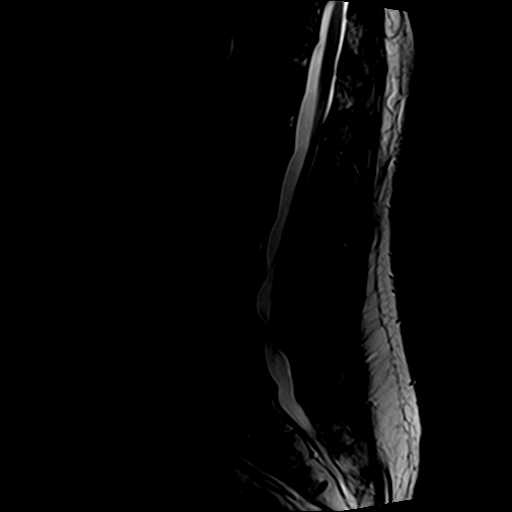
[im 10/14]
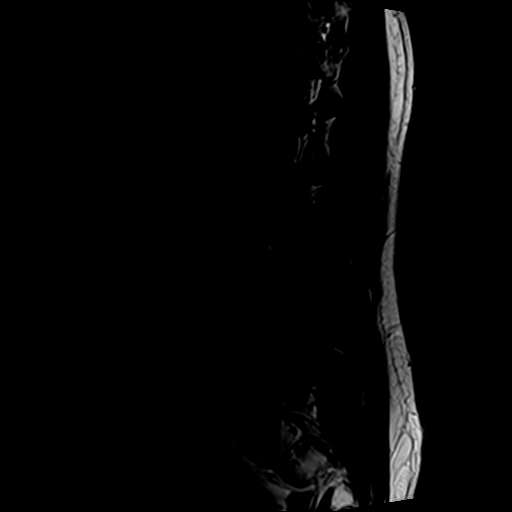
[im 14/14]
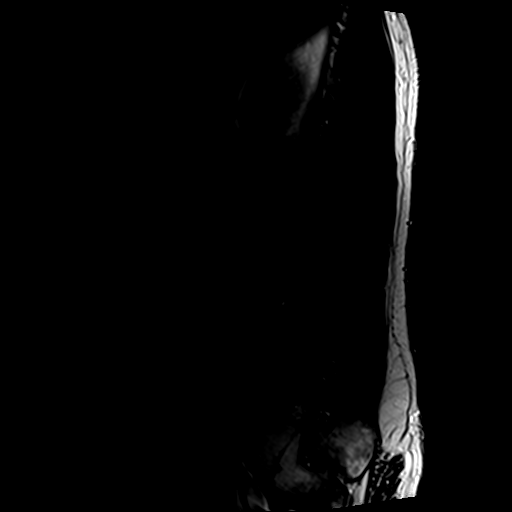

[Series 4: T1 · sagittal · 4.0mm · 0.55mm/px · 5 of 14 slices shown (1 of 2)]
[im 1/14]
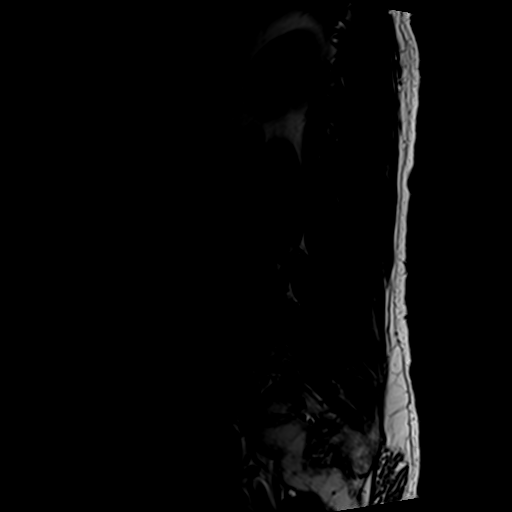
[im 4/14]
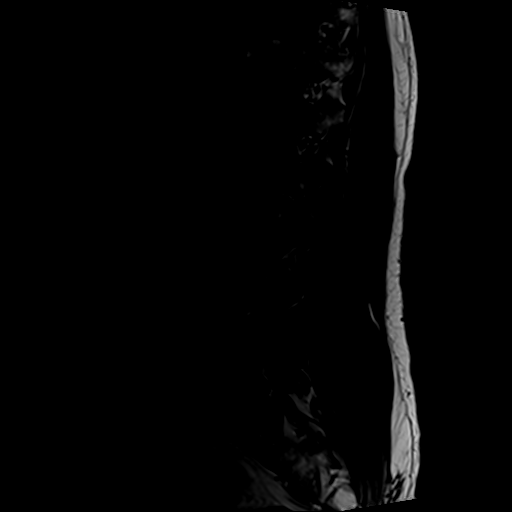
[im 7/14]
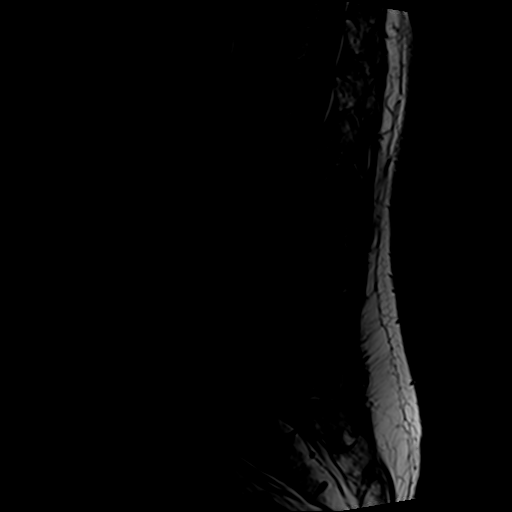
[im 10/14]
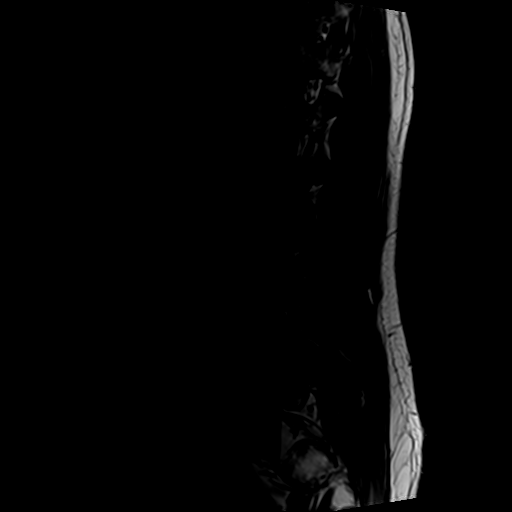
[im 14/14]
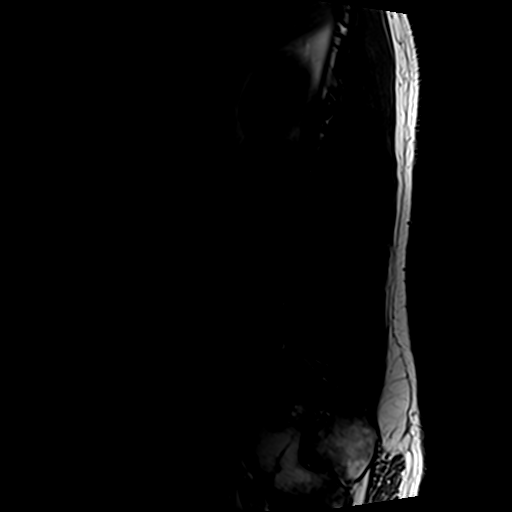

[Series 6: T2 · axial · 4.0mm · 0.70mm/px · z∈[-67,+129]mm · 10 of 39 slices shown (2 of 2)]
[im 3/39]
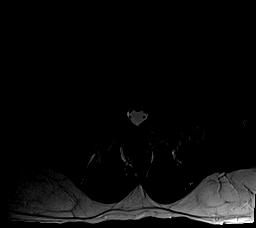
[im 6/39]
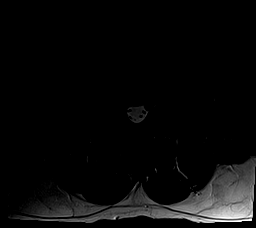
[im 8/39]
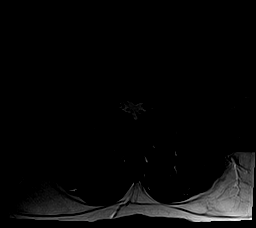
[im 13/39]
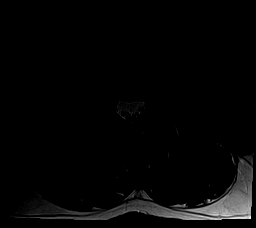
[im 18/39]
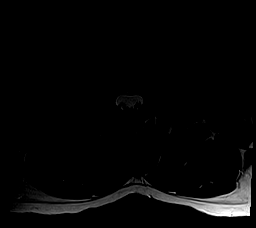
[im 21/39]
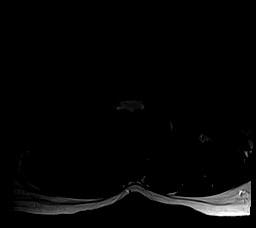
[im 23/39]
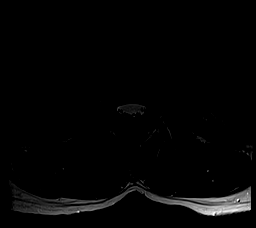
[im 28/39]
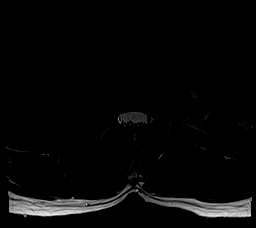
[im 33/39]
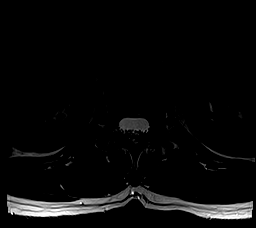
[im 39/39]
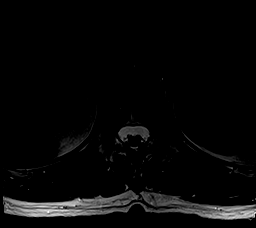

[Series 7: T1 · axial · 4.0mm · 0.35mm/px · z∈[-67,+99]mm · 7 of 39 slices shown (2 of 2)]
[im 3/39]
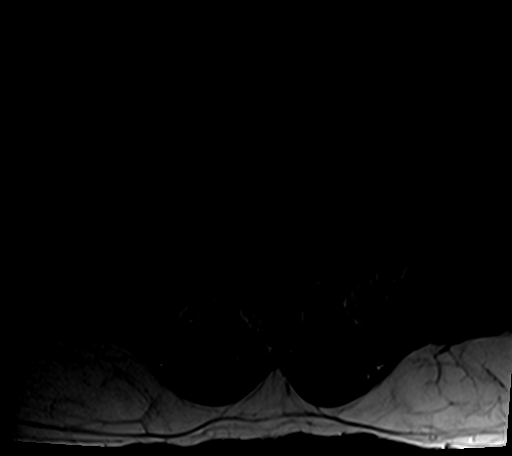
[im 6/39]
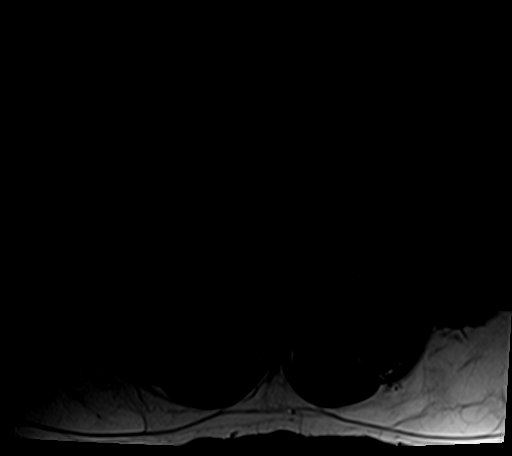
[im 8/39]
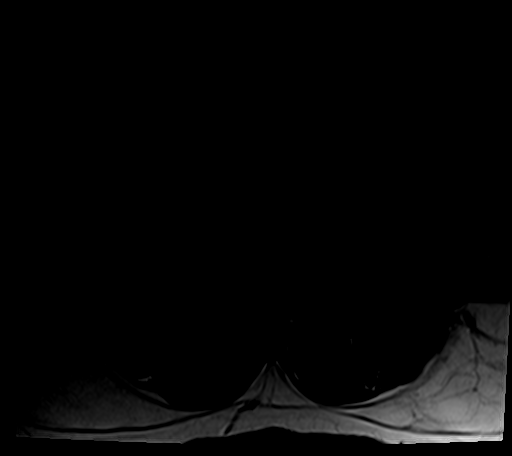
[im 13/39]
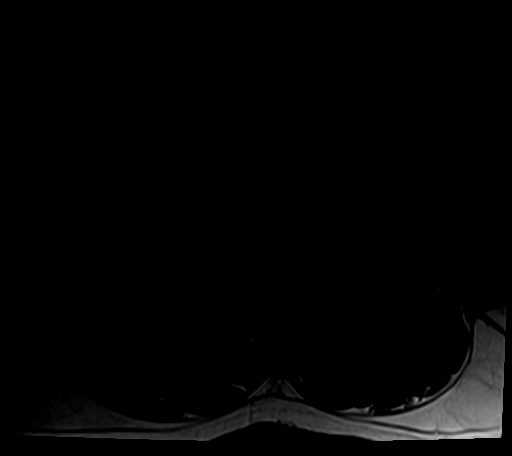
[im 18/39]
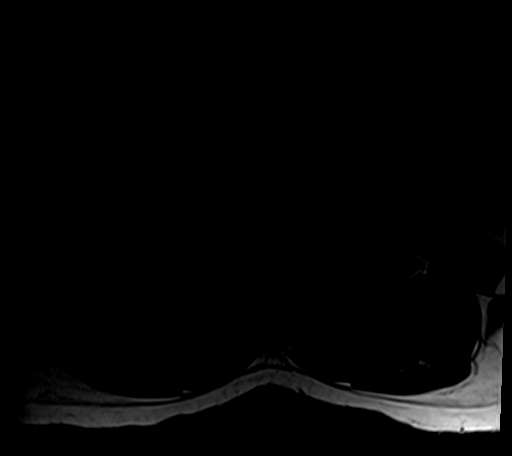
[im 21/39]
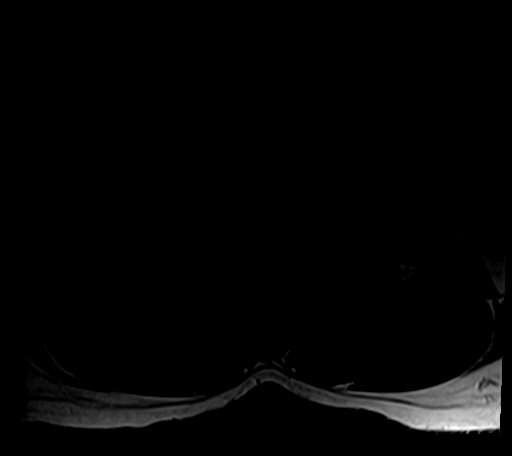
[im 33/39]
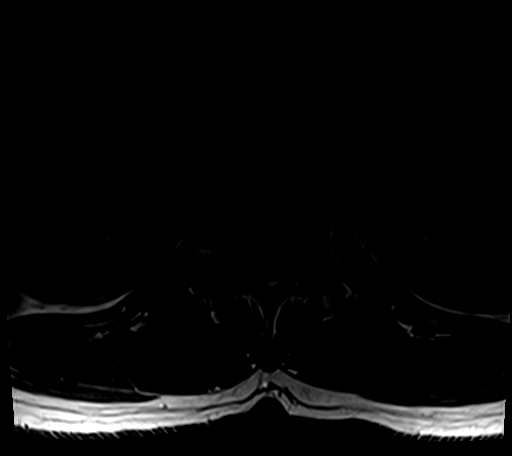

[27 of 48 positions shown; findings below may reference images not displayed]

FINDINGS: Segmentation:  Normal

Alignment:  Mild retrolisthesis L3-4.  Mild anterolisthesis L4-5.

Vertebrae:  Normal bone marrow.  Negative for fracture or mass.

Conus medullaris and cauda equina: Conus extends to the L1-2 level.
Conus and cauda equina appear normal.

Paraspinal and other soft tissues: Negative for paraspinous mass or
fluid collection

Disc levels:

L1-2: Mild disc bulging.  Negative for stenosis

L2-3: Mild disc bulging and mild facet degeneration. Negative for
stenosis.

L3-4: Small disc protrusion on the left involving the foramen and
extraforaminal space. Mild subarticular stenosis. No significant
spinal stenosis

L4-5: Mild anterolisthesis. Broad-based left-sided disc protrusion
with subarticular stenosis. Moderate subarticular and foraminal
stenosis bilaterally. Moderate spinal stenosis. Moderate facet
degeneration bilaterally

L5-S1: Small central and right-sided disc protrusion. Expected
impingement of the right S1 nerve root. Bilateral facet hypertrophy.
IMPRESSION: Small left-sided disc protrusion L3-4

Grade 1 anterolisthesis L4-5 with disc and facet degeneration
causing moderate spinal stenosis. Broad-based left-sided disc
protrusion. Moderate subarticular foraminal stenosis bilaterally

Central and right-sided disc protrusion L5-S1 with expected
impingement right S1 nerve root.

## 2020-04-13 ENCOUNTER — Other Ambulatory Visit: Payer: Self-pay | Admitting: Physical Medicine and Rehabilitation

## 2020-04-13 ENCOUNTER — Telehealth: Payer: Self-pay | Admitting: Physical Medicine and Rehabilitation

## 2020-04-13 MED ORDER — MELOXICAM 15 MG PO TABS: 15.0000 mg | ORAL_TABLET | Freq: Every day | ORAL | 1 refills | Status: DC

## 2020-04-13 NOTE — Telephone Encounter (Signed)
Called patient to advise  °

## 2020-04-13 NOTE — Telephone Encounter (Signed)
I do remember now, I just sent in meloxicam. It is once per day for a few days or as needed. Do not ibuprofen or alleve on days that it is taken

## 2020-04-13 NOTE — Telephone Encounter (Signed)
Patient states meloxicam vs celebrex to take instead of advil or aleve. He states that the injections have helped with mobility, but he does sometimes "get achy" (on the left side now) for a few days and has to take something to help and modify his activity. He wants something that he can take for a few days when needed.

## 2020-04-13 NOTE — Telephone Encounter (Signed)
Patient called requesting a call back about medications that was discussed with Dr. Ernestina Patches. Patient has questions about Dr. Romona Curls recommendations. Please call patient at [phone].

## 2020-04-13 NOTE — Telephone Encounter (Signed)
Please advise. I do not see medications mentioned in notes other than Ibuprofen which he was already taking.

## 2020-04-13 NOTE — Telephone Encounter (Signed)
Just see if you can get more info on question, I dictated some of what we talked about but did not mention medications

## 2020-05-12 ENCOUNTER — Other Ambulatory Visit: Payer: Self-pay | Admitting: Physical Medicine and Rehabilitation

## 2020-05-12 ENCOUNTER — Telehealth: Payer: Self-pay | Admitting: Physical Medicine and Rehabilitation

## 2020-05-12 NOTE — Telephone Encounter (Signed)
Please advise 

## 2020-05-12 NOTE — Telephone Encounter (Signed)
Pt called wanting too discuss the use of the meloxicam as well as discuss L5 and S1 with  Dr. Ernestina Patches    [phone]

## 2020-05-12 NOTE — Telephone Encounter (Signed)
Best to OV. Meloxicam ok for 2 to3 week periods then off for a while etc. Taking longterm everyday if followed by PCP.

## 2020-05-12 NOTE — Telephone Encounter (Signed)
Called patient to advise and scheduled for OV.

## 2020-05-25 ENCOUNTER — Ambulatory Visit (INDEPENDENT_AMBULATORY_CARE_PROVIDER_SITE_OTHER): Payer: 59 | Admitting: Physical Medicine and Rehabilitation

## 2020-05-25 ENCOUNTER — Encounter: Payer: Self-pay | Admitting: Physical Medicine and Rehabilitation

## 2020-05-25 ENCOUNTER — Other Ambulatory Visit: Payer: Self-pay

## 2020-05-25 VITALS — BP 149/102 | HR 73

## 2020-05-25 DIAGNOSIS — M47816 Spondylosis without myelopathy or radiculopathy, lumbar region: Secondary | ICD-10-CM | POA: Diagnosis not present

## 2020-05-25 DIAGNOSIS — M48062 Spinal stenosis, lumbar region with neurogenic claudication: Secondary | ICD-10-CM

## 2020-05-25 DIAGNOSIS — M4316 Spondylolisthesis, lumbar region: Secondary | ICD-10-CM

## 2020-05-25 DIAGNOSIS — G8929 Other chronic pain: Secondary | ICD-10-CM

## 2020-05-25 DIAGNOSIS — M5441 Lumbago with sciatica, right side: Secondary | ICD-10-CM

## 2020-05-25 DIAGNOSIS — M5442 Lumbago with sciatica, left side: Secondary | ICD-10-CM

## 2020-05-25 DIAGNOSIS — M48061 Spinal stenosis, lumbar region without neurogenic claudication: Secondary | ICD-10-CM

## 2020-05-25 MED ORDER — MELOXICAM 15 MG PO TABS: ORAL_TABLET | ORAL | 1 refills | Status: DC

## 2020-05-25 NOTE — Progress Notes (Signed)
Pain in lower back and radiating to hips and lower legs. Used Meloxicam for a month and was able to work out again. Has been off meloxicam for a few weeks, and a few days after stopping he had to start taking Advil at least once a day.  Numeric Pain Rating Scale and Functional Assessment Average Pain 7 Pain Right Now 4 My pain is constant and aching Pain is worse with: some activites Pain improves with: rest   In the last MONTH (on 0-10 scale) has pain interfered with the following?  1. General activity like being  able to carry out your everyday physical activities such as walking, climbing stairs, carrying groceries, or moving a chair?  Rating(7)  2. Relation with others like being able to carry out your usual social activities and roles such as  activities at home, at work and in your community. Rating(7)  3. Enjoyment of life such that you have  been bothered by emotional problems such as feeling anxious, depressed or irritable?  Rating(5)

## 2020-05-26 ENCOUNTER — Encounter: Payer: Self-pay | Admitting: Physical Medicine and Rehabilitation

## 2020-05-26 NOTE — Progress Notes (Signed)
Joseph Davila - 59 y.o. male MRN [id number]  Date of birth: 04-11-1961  Office Visit Note: Visit Date: 05/25/2020 PCP: Kristen Loader, FNP Referred by: Kristen Loader, FNP  Subjective: Chief Complaint  Patient presents with  . Lower Back - Pain   HPI: Joseph Davila is a 59 y.o. male who comes in today For evaluation and management of chronic worsening severe low back pain bilateral hip pain.  He does get referral in the hips and lower legs at times as well.  He rates his average pain is a 7 out of 10 which is constant and aching worse with some activities.  It does limit what he like to do in general.  He is however very active and continues to lift weights and exercise which we have encouraged.  We did go over today some the exercises that he has been doing.  By way of quick review we have completed epidural injection with decent relief of symptoms to the point where he really did not require any medication.  His history is complicated by history of positive rheumatoid factor but not necessarily positive rheumatoid arthritis.  History of irritable bowel syndrome.  He denies any changes in terms of trauma or focal weakness since we last saw him.  No red flag complaints.  We did up prescribing meloxicam for a month and he says after about 5 days of taking it he really could do just about anything he wanted to do and was very pleased with the results of the meloxicam.  He been taking ibuprofen off and on.  He has no history of diabetes or heart disease.  No history of GI ulcers.  He reports now that he has been off the meloxicam for a few weeks it starting to really creep back and given difficulty.  He reports that he is now taking Advil at least once a day.  He has had MRI of the lumbar spine which is reviewed again today and reviewed below.  Showing facet arthropathy of the lower spine as well as moderate stenosis.  Review of Systems  Musculoskeletal: Positive for back pain.       Bilateral leg  pain  All other systems reviewed and are negative.  Otherwise per HPI.  Assessment & Plan: Visit Diagnoses:  1. Spinal stenosis of lumbar region with neurogenic claudication   2. Chronic bilateral low back pain with bilateral sciatica   3. Spondylolisthesis of lumbar region   4. Spondylosis without myelopathy or radiculopathy, lumbar region   5. Bilateral stenosis of lateral recess of lumbar spine     Plan: Findings:  Chronic worsening severe at times low back pain bilateral with bilateral radicular complaints consistent with combination of lumbar spondylosis and arthritis with small listhesis as well as moderate narrowing of the canal and lateral recesses.  Epidural injection diagnostically did improve his pain quite a bit.  He is still very active does work out a lot and does continue to exercise quite a bit which is good.  He reports really outstanding relief with meloxicam use daily for a month.  Once he came off of that however the symptoms did start to return.  He questions using that medication more long-term.  Went over the risk the benefit of that.  I will prescribe meloxicam again but I would asked that he follow-up mainly with his primary care provider who is Mitzi Hansen brake, FNP.  If this became more of a long-term issue that son that I think  they would be better at managing.  We did talk about maybe using the meloxicam for a couple of months and then maybe switching to Tylenol for a time and just give himself a break from that and that may mitigate some of the overall risk.  He does not have any heart disease or diabetes.  We also talked about diagnostic facet joint blocks and radiofrequency ablation for more of the back pain.  He does get both back and leg pain however.  At this point he is going to try the meloxicam and then at some point will probably come off the meloxicam and if he was getting pain once again we would look at diagnostic medial branch blocks and a goal towards ablation for  his back pain.    Meds & Orders:  Meds ordered this encounter  Medications  . meloxicam (MOBIC) 15 MG tablet    Sig: TAKE 1 TABLET(15 MG) BY MOUTH DAILY WITH FOOD    Dispense:  30 tablet    Refill:  1    ZERO refills remain on this prescription. Your patient is requesting advance approval of refills for this medication to [facility]   No orders of the defined types were placed in this encounter.   Follow-up: Return if symptoms worsen or fail to improve, for Consider medial branch block.   Procedures: No procedures performed  No notes on file   Clinical History: MRI LUMBAR SPINE WITHOUT CONTRAST  TECHNIQUE: Multiplanar, multisequence MR imaging of the lumbar spine was performed. No intravenous contrast was administered.  COMPARISON:  None.  FINDINGS: Segmentation:  Normal  Alignment:  Mild retrolisthesis L3-4.  Mild anterolisthesis L4-5.  Vertebrae:  Normal bone marrow.  Negative for fracture or mass.  Conus medullaris and cauda equina: Conus extends to the L1-2 level. Conus and cauda equina appear normal.  Paraspinal and other soft tissues: Negative for paraspinous mass or fluid collection  Disc levels:  L1-2: Mild disc bulging.  Negative for stenosis  L2-3: Mild disc bulging and mild facet degeneration. Negative for stenosis.  L3-4: Small disc protrusion on the left involving the foramen and extraforaminal space. Mild subarticular stenosis. No significant spinal stenosis  L4-5: Mild anterolisthesis. Broad-based left-sided disc protrusion with subarticular stenosis. Moderate subarticular and foraminal stenosis bilaterally. Moderate spinal stenosis. Moderate facet degeneration bilaterally  L5-S1: Small central and right-sided disc protrusion. Expected impingement of the right S1 nerve root. Bilateral facet hypertrophy.  IMPRESSION: Small left-sided disc protrusion L3-4  Grade 1 anterolisthesis L4-5 with disc and facet  degeneration causing moderate spinal stenosis. Broad-based left-sided disc protrusion. Moderate subarticular foraminal stenosis bilaterally  Central and right-sided disc protrusion L5-S1 with expected impingement right S1 nerve root.   Electronically Signed   By: Franchot Gallo M.D.   On: 09/18/2018 09:10   He reports that he has never smoked. He has never used smokeless tobacco. No results for input(s): HGBA1C, LABURIC in the last 8760 hours.  Objective:  VS:  HT:    WT:   BMI:     BP:(!) 149/102  HR:73bpm  TEMP: ( )  RESP:  Physical Exam Vitals and nursing note reviewed.  Constitutional:      General: He is not in acute distress.    Appearance: Normal appearance. He is well-developed.  HENT:     Head: Normocephalic and atraumatic.  Eyes:     Conjunctiva/sclera: Conjunctivae normal.     Pupils: Pupils are equal, round, and reactive to light.  Cardiovascular:  Rate and Rhythm: Normal rate.     Pulses: Normal pulses.     Heart sounds: Normal heart sounds.  Pulmonary:     Effort: Pulmonary effort is normal. No respiratory distress.  Musculoskeletal:     Cervical back: Normal range of motion and neck supple. No rigidity.     Right lower leg: No edema.     Left lower leg: No edema.     Comments: Patient ambulates without aid he is somewhat slow to rise from seated position to full extension.  He has good distal strength with no pain with rotation of the hips no pain over the greater trochanters.  He does have concordant low back pain with extension and facet loading.  Skin:    General: Skin is warm and dry.     Findings: No erythema or rash.  Neurological:     General: No focal deficit present.     Mental Status: He is alert and oriented to person, place, and time.     Sensory: No sensory deficit.     Coordination: Coordination normal.     Gait: Gait normal.  Psychiatric:        Mood and Affect: Mood normal.        Behavior: Behavior normal.     Ortho  Exam  Imaging: No results found.  Past Medical/Family/Surgical/Social History: Medications & Allergies reviewed per EMR, new medications updated. Patient Active Problem List   Diagnosis Date Noted  . Spinal stenosis of lumbar region with neurogenic claudication 12/12/2019  . Rheumatoid factor positive 12/12/2019  . Giardiasis 12/12/2019  . History of IBS 12/12/2019  . History of asthma 12/12/2019  . Seasonal allergies 12/12/2019  . Dyslipidemia 12/12/2019   Past Medical History:  Diagnosis Date  . Asthma   . High cholesterol   . Seasonal allergies    Family History  Problem Relation Age of Onset  . Dementia Mother   . Cancer Father    Past Surgical History:  Procedure Laterality Date  . VASECTOMY     Social History   Occupational History  . Not on file  Tobacco Use  . Smoking status: Never Smoker  . Smokeless tobacco: Never Used  Vaping Use  . Vaping Use: Never used  Substance and Sexual Activity  . Alcohol use: Yes    Alcohol/week: 4.0 standard drinks    Types: 4 Cans of beer per week  . Drug use: Never  . Sexual activity: Not on file

## 2020-06-04 NOTE — Progress Notes (Deleted)
   Office Visit Note  Patient: Joseph Davila             Date of Birth: 07-04-1961           MRN: [id number]             PCP: Kristen Loader, FNP Referring: Kristen Loader, FNP Visit Date: 06/11/2020 Occupation: @GUAROCC @  Subjective:  No chief complaint on file.   History of Present Illness: Joseph Davila is a 59 y.o. male ***   Activities of Daily Living:  Patient reports morning stiffness for *** {minute/hour:19697}.   Patient {ACTIONS;DENIES/REPORTS:21021675::"Denies"} nocturnal pain.  Difficulty dressing/grooming: {ACTIONS;DENIES/REPORTS:21021675::"Denies"} Difficulty climbing stairs: {ACTIONS;DENIES/REPORTS:21021675::"Denies"} Difficulty getting out of chair: {ACTIONS;DENIES/REPORTS:21021675::"Denies"} Difficulty using hands for taps, buttons, cutlery, and/or writing: {ACTIONS;DENIES/REPORTS:21021675::"Denies"}  No Rheumatology ROS completed.   PMFS History:  Patient Active Problem List   Diagnosis Date Noted  . Spinal stenosis of lumbar region with neurogenic claudication 12/12/2019  . Rheumatoid factor positive 12/12/2019  . Giardiasis 12/12/2019  . History of IBS 12/12/2019  . History of asthma 12/12/2019  . Seasonal allergies 12/12/2019  . Dyslipidemia 12/12/2019    Past Medical History:  Diagnosis Date  . Asthma   . High cholesterol   . Seasonal allergies     Family History  Problem Relation Age of Onset  . Dementia Mother   . Cancer Father    Past Surgical History:  Procedure Laterality Date  . VASECTOMY     Social History   Social History Narrative  . Not on file    There is no immunization history on file for this patient.   Objective: Vital Signs: There were no vitals taken for this visit.   Physical Exam   Musculoskeletal Exam: ***  CDAI Exam: CDAI Score: -- Patient Global: --; Provider Global: -- Swollen: --; Tender: -- Joint Exam 06/11/2020   No joint exam has been documented for this visit   There is currently no  information documented on the homunculus. Go to the Rheumatology activity and complete the homunculus joint exam.  Investigation: No additional findings.  Imaging: No results found.  Recent Labs: No results found for: WBC, HGB, PLT, NA, K, CL, CO2, GLUCOSE, BUN, CREATININE, BILITOT, ALKPHOS, AST, ALT, PROT, ALBUMIN, CALCIUM, GFRAA, QFTBGOLD, QFTBGOLDPLUS  Speciality Comments: No specialty comments available.  Procedures:  No procedures performed Allergies: Patient has no allergy information on record.   July 03, 2019 vitamin D 41.1 CPK 262 (52-200) RF 25.1 (0-13.9), ANA negative, CRP negative, ESR 8, CBC normal, LDL 140, CMP normal, PSA normal June 21, 2018 RF 44  Assessment / Plan:     Visit Diagnoses: No diagnosis found.  Orders: No orders of the defined types were placed in this encounter.  No orders of the defined types were placed in this encounter.   Face-to-face time spent with patient was *** minutes. Greater than 50% of time was spent in counseling and coordination of care.  Follow-Up Instructions: No follow-ups on file.   Bo Merino, MD  Note - This record has been created using Editor, commissioning.  Chart creation errors have been sought, but may not always  have been located. Such creation errors do not reflect on  the standard of medical care.

## 2020-06-11 ENCOUNTER — Ambulatory Visit: Payer: 59 | Admitting: Rheumatology

## 2020-07-06 ENCOUNTER — Telehealth: Payer: Self-pay | Admitting: Physical Medicine and Rehabilitation

## 2020-07-06 NOTE — Telephone Encounter (Signed)
Please advise on levels for MBB.

## 2020-07-06 NOTE — Telephone Encounter (Signed)
L4-5 and L5-s1

## 2020-07-06 NOTE — Telephone Encounter (Signed)
Patient called advised he is off the Meloxicam and would like to set up an appointment for the test. The number to contact patient is [phone]

## 2020-07-07 NOTE — Telephone Encounter (Signed)
Is auth needed for bilateral L4-5 and L5-S1 MBB/ facets? Scheduled for 10/19 with driver.

## 2020-07-07 NOTE — Telephone Encounter (Signed)
Pt not req Auth for [id number].

## 2020-07-20 ENCOUNTER — Other Ambulatory Visit: Payer: Self-pay

## 2020-07-20 ENCOUNTER — Ambulatory Visit: Payer: Self-pay

## 2020-07-20 ENCOUNTER — Ambulatory Visit (INDEPENDENT_AMBULATORY_CARE_PROVIDER_SITE_OTHER): Payer: 59 | Admitting: Physical Medicine and Rehabilitation

## 2020-07-20 ENCOUNTER — Encounter: Payer: Self-pay | Admitting: Physical Medicine and Rehabilitation

## 2020-07-20 VITALS — BP 149/98 | HR 66

## 2020-07-20 DIAGNOSIS — M5441 Lumbago with sciatica, right side: Secondary | ICD-10-CM | POA: Diagnosis not present

## 2020-07-20 DIAGNOSIS — G8929 Other chronic pain: Secondary | ICD-10-CM | POA: Diagnosis not present

## 2020-07-20 DIAGNOSIS — M4316 Spondylolisthesis, lumbar region: Secondary | ICD-10-CM

## 2020-07-20 DIAGNOSIS — M5442 Lumbago with sciatica, left side: Secondary | ICD-10-CM | POA: Diagnosis not present

## 2020-07-20 MED ORDER — BUPIVACAINE HCL 0.5 % IJ SOLN
3.0000 mL | Freq: Once | INTRAMUSCULAR | Status: AC
  Administered 2020-07-20: 3 mL

## 2020-07-20 MED ORDER — METHYLPREDNISOLONE ACETATE 80 MG/ML IJ SUSP
80.0000 mg | Freq: Once | INTRAMUSCULAR | Status: AC
  Administered 2020-07-20: 80 mg

## 2020-07-20 NOTE — Progress Notes (Signed)
Pt state Lower back pain. Pt state bending and lifting makes the pain worse.Pt state he feels worse when he awakes up in the morning. Pt state he takes pain meds to ease the pain.   Numeric Pain Rating Scale and Functional Assessment Average Pain 7   In the last MONTH (on 0-10 scale) has pain interfered with the following?  1. General activity like being  able to carry out your everyday physical activities such as walking, climbing stairs, carrying groceries, or moving a chair?  Rating(8)   +Driver, -BT, -Dye Allergies.

## 2020-07-23 ENCOUNTER — Telehealth: Payer: Self-pay | Admitting: Physical Medicine and Rehabilitation

## 2020-07-23 NOTE — Telephone Encounter (Signed)
Patient called. Says he emailed his pain diary. Wanted to make sure we got it. His call back number is [phone]

## 2020-07-26 ENCOUNTER — Telehealth: Payer: Self-pay | Admitting: Physical Medicine and Rehabilitation

## 2020-07-26 NOTE — Telephone Encounter (Signed)
Pt called stating he would like to speak with Shena in regards to a pain diary that he is trying to send in   [phone]

## 2020-07-26 NOTE — Telephone Encounter (Signed)
Called pt and lvm #1, I haven't received the pain dairy.

## 2020-07-26 NOTE — Telephone Encounter (Signed)
Called pt and received info needed.

## 2020-08-09 ENCOUNTER — Ambulatory Visit: Payer: Self-pay

## 2020-08-09 ENCOUNTER — Encounter: Payer: Self-pay | Admitting: Physical Medicine and Rehabilitation

## 2020-08-09 ENCOUNTER — Other Ambulatory Visit: Payer: Self-pay

## 2020-08-09 ENCOUNTER — Ambulatory Visit (INDEPENDENT_AMBULATORY_CARE_PROVIDER_SITE_OTHER): Payer: 59 | Admitting: Physical Medicine and Rehabilitation

## 2020-08-09 VITALS — BP 151/100 | HR 67

## 2020-08-09 DIAGNOSIS — M47816 Spondylosis without myelopathy or radiculopathy, lumbar region: Secondary | ICD-10-CM

## 2020-08-09 MED ORDER — BUPIVACAINE HCL 0.5 % IJ SOLN
3.0000 mL | Freq: Once | INTRAMUSCULAR | Status: AC
Start: 2020-08-09 — End: 2020-08-09
  Administered 2020-08-09: 3 mL

## 2020-08-09 NOTE — Progress Notes (Signed)
Joseph Davila - 59 y.o. male MRN [id number]  Date of birth: Apr 13, 1961  Office Visit Note: Visit Date: 07/20/2020 PCP: Kristen Loader, FNP Referred by: Kristen Loader, FNP  Subjective: Chief Complaint  Patient presents with  . Lower Back - Pain  . Left Leg - Pain  . Right Leg - Pain   HPI:  Joseph Davila is a 59 y.o. male who comes in today at the request of Dr. Laurence Spates for planned Bilateral L4-L5 L5-S1 Lumbar facet/medial branch block with fluoroscopic guidance.  The patient has failed conservative care including home exercise, medications, time and activity modification.  This injection will be diagnostic and hopefully therapeutic.  Please see requesting physician notes for further details and justification.  Exam has shown concordant pain with facet joint loading.  Referring provider: Dr. Bo Merino   He essentially has 2 competing issues in terms of his back and hip and leg pain.  Currently is just having back pain status post epidural injection.  Epidural injection relieve the buttock and claudication type pain from the stenosis.  He has facet arthropathy which is causing the stenosis of the 2 are related obviously.  We are going to complete diagnostic medial branch blocks to see how much of his back pain is related just to the arthritis itself and not the stenosis.  Again he has 2 competing issues epidural injections helped to claudication symptoms.  ROS Otherwise per HPI.  Assessment & Plan: Visit Diagnoses:  1. Spondylolisthesis of lumbar region   2. Chronic bilateral low back pain with bilateral sciatica     Plan: No additional findings.   Meds & Orders:  Meds ordered this encounter  Medications  . bupivacaine (MARCAINE) 0.5 % (with pres) injection 3 mL  . methylPREDNISolone acetate (DEPO-MEDROL) injection 80 mg    Orders Placed This Encounter  Procedures  . Facet Injection  . XR C-ARM NO REPORT    Follow-up: Return for Review Pain Diary.    Procedures: No procedures performed  Lumbar Diagnostic Facet Joint Nerve Block with Fluoroscopic Guidance   Patient: Joseph Davila      Date of Birth: Jan 02, 1961 MRN: [id number] PCP: Kristen Loader, FNP      Visit Date: 07/20/2020   Universal Protocol:    Date/Time: 11/08/215:29 AM  Consent Given By: the patient  Position: PRONE  Additional Comments: Vital signs were monitored before and after the procedure. Patient was prepped and draped in the usual sterile fashion. The correct patient, procedure, and site was verified.   Injection Procedure Details:   Procedure diagnoses:  1. Spondylolisthesis of lumbar region   2. Chronic bilateral low back pain with bilateral sciatica      Meds Administered:  Meds ordered this encounter  Medications  . bupivacaine (MARCAINE) 0.5 % (with pres) injection 3 mL  . methylPREDNISolone acetate (DEPO-MEDROL) injection 80 mg     Laterality: Bilateral  Location/Site:  L4-L5 L5-S1  Needle size: 22 ga.  Needle type:spinal  Needle Placement: Oblique pedical  Findings:   -Comments: There was excellent flow of contrast along the articular pillars without intravascular flow.  Procedure Details: The fluoroscope beam is vertically oriented in AP and then obliqued 15 to 20 degrees to the ipsilateral side of the desired nerve to achieve the "Scotty dog" appearance.  The skin over the target area of the junction of the superior articulating process and the transverse process (sacral ala if blocking the L5 dorsal rami) was locally anesthetized with a 1 ml  volume of 1% Lidocaine without Epinephrine.  The spinal needle was inserted and advanced in a trajectory view down to the target.   After contact with periosteum and negative aspirate for blood and CSF, correct placement without intravascular or epidural spread was confirmed by injecting 0.5 ml. of Isovue-250.  A spot radiograph was obtained of this image.    Next, a 0.5 ml. volume of the  injectate described above was injected. The needle was then redirected to the other facet joint nerves mentioned above if needed.  Prior to the procedure, the patient was given a Pain Diary which was completed for baseline measurements.  After the procedure, the patient rated their pain every 30 minutes and will continue rating at this frequency for a total of 5 hours.  The patient has been asked to complete the Diary and return to [place] by mail, fax or hand delivered as soon as possible.   Additional Comments:  The patient tolerated the procedure well Dressing: 2 x 2 sterile gauze and Band-Aid    Post-procedure details: Patient was observed during the procedure. Post-procedure instructions were reviewed.  Patient left the clinic in stable condition.    Clinical History: MRI LUMBAR SPINE WITHOUT CONTRAST  TECHNIQUE: Multiplanar, multisequence MR imaging of the lumbar spine was performed. No intravenous contrast was administered.  COMPARISON:  None.  FINDINGS: Segmentation:  Normal  Alignment:  Mild retrolisthesis L3-4.  Mild anterolisthesis L4-5.  Vertebrae:  Normal bone marrow.  Negative for fracture or mass.  Conus medullaris and cauda equina: Conus extends to the L1-2 level. Conus and cauda equina appear normal.  Paraspinal and other soft tissues: Negative for paraspinous mass or fluid collection  Disc levels:  L1-2: Mild disc bulging.  Negative for stenosis  L2-3: Mild disc bulging and mild facet degeneration. Negative for stenosis.  L3-4: Small disc protrusion on the left involving the foramen and extraforaminal space. Mild subarticular stenosis. No significant spinal stenosis  L4-5: Mild anterolisthesis. Broad-based left-sided disc protrusion with subarticular stenosis. Moderate subarticular and foraminal stenosis bilaterally. Moderate spinal stenosis. Moderate facet degeneration bilaterally  L5-S1: Small central and right-sided disc protrusion.  Expected impingement of the right S1 nerve root. Bilateral facet hypertrophy.  IMPRESSION: Small left-sided disc protrusion L3-4  Grade 1 anterolisthesis L4-5 with disc and facet degeneration causing moderate spinal stenosis. Broad-based left-sided disc protrusion. Moderate subarticular foraminal stenosis bilaterally  Central and right-sided disc protrusion L5-S1 with expected impingement right S1 nerve root.   Electronically Signed   By: Franchot Gallo M.D.   On: 09/18/2018 09:10     Objective:  VS:  HT:    WT:   BMI:     BP:(!) 149/98  HR:66bpm  TEMP: ( )  RESP:  Physical Exam Constitutional:      General: He is not in acute distress.    Appearance: Normal appearance. He is not ill-appearing.  HENT:     Head: Normocephalic and atraumatic.     Right Ear: External ear normal.     Left Ear: External ear normal.  Eyes:     Extraocular Movements: Extraocular movements intact.  Cardiovascular:     Rate and Rhythm: Normal rate.     Pulses: Normal pulses.  Abdominal:     General: There is no distension.     Palpations: Abdomen is soft.  Musculoskeletal:        General: No tenderness or signs of injury.     Right lower leg: No edema.     Left lower  leg: No edema.     Comments: Patient has good distal strength without clonus. Patient somewhat slow to rise from a seated position to full extension.  There is concordant low back pain with facet loading and lumbar spine extension rotation.  There are no definitive trigger points but the patient is somewhat tender across the lower back and PSIS.  There is no pain with hip rotation.   Skin:    Findings: No erythema or rash.  Neurological:     General: No focal deficit present.     Mental Status: He is alert and oriented to person, place, and time.     Sensory: No sensory deficit.     Motor: No weakness or abnormal muscle tone.     Coordination: Coordination normal.  Psychiatric:        Mood and Affect: Mood normal.         Behavior: Behavior normal.      Imaging: No results found.

## 2020-08-09 NOTE — Progress Notes (Signed)
Pain in low back radiating down both sides. Good relief from first MBB.  Does not want bandaids today because they pulled so much last time that it was "hard to discern pain." Numeric Pain Rating Scale and Functional Assessment Average Pain 7   In the last MONTH (on 0-10 scale) has pain interfered with the following?  1. General activity like being  able to carry out your everyday physical activities such as walking, climbing stairs, carrying groceries, or moving a chair?  Rating(7)   +Driver, -BT, -Dye Allergies.

## 2020-08-09 NOTE — Progress Notes (Signed)
Joseph Davila - 59 y.o. male MRN [id number]  Date of birth: 28-Jun-1961  Office Visit Note: Visit Date: 08/09/2020 PCP: Kristen Loader, FNP Referred by: Kristen Loader, FNP  Subjective: Chief Complaint  Patient presents with  . Lower Back - Pain  . Right Leg - Pain  . Left Leg - Pain   HPI:  Joseph Davila is a 59 y.o. male who comes in today for planned repeat Bilateral L4-L5 L5-S1 Lumbar facet/medial branch block with fluoroscopic guidance.  The patient has failed conservative care including home exercise, medications, time and activity modification.  This injection will be diagnostic and hopefully therapeutic.  Please see requesting physician notes for further details and justification.  Exam shows concordant low back pain with facet joint loading and extension. Patient received more than 80% pain relief from prior injection. This would be the second block in a diagnostic double block paradigm.     Referring:Dr. Abel Presto Deveshwar   ROS Otherwise per HPI.  Assessment & Plan: Visit Diagnoses:  1. Spondylosis without myelopathy or radiculopathy, lumbar region     Plan: No additional findings.   Meds & Orders:  Meds ordered this encounter  Medications  . bupivacaine (MARCAINE) 0.5 % (with pres) injection 3 mL    Orders Placed This Encounter  Procedures  . Facet Injection  . XR C-ARM NO REPORT    Follow-up: Return for Review Pain Diary.   Procedures: No procedures performed  Lumbar Diagnostic Facet Joint Nerve Block with Fluoroscopic Guidance   Patient: Joseph Davila      Date of Birth: Apr 02, 1961 MRN: [id number] PCP: Kristen Loader, FNP      Visit Date: 08/09/2020   Universal Protocol:    Date/Time: 11/08/218:20 AM  Consent Given By: the patient  Position: PRONE  Additional Comments: Vital signs were monitored before and after the procedure. Patient was prepped and draped in the usual sterile fashion. The correct patient, procedure, and site was  verified.   Injection Procedure Details:   Procedure diagnoses:  1. Spondylosis without myelopathy or radiculopathy, lumbar region      Meds Administered:  Meds ordered this encounter  Medications  . bupivacaine (MARCAINE) 0.5 % (with pres) injection 3 mL     Laterality: Bilateral  Location/Site:  L4-L5 L5-S1  Needle:5.0 in., 25 ga.  Short bevel or Quincke spinal needle  Needle Placement: Oblique pedical  Findings:   -Comments: There was excellent flow of contrast along the articular pillars without intravascular flow.  Procedure Details: The fluoroscope beam is vertically oriented in AP and then obliqued 15 to 20 degrees to the ipsilateral side of the desired nerve to achieve the "Scotty dog" appearance.  The skin over the target area of the junction of the superior articulating process and the transverse process (sacral ala if blocking the L5 dorsal rami) was locally anesthetized with a 1 ml volume of 1% Lidocaine without Epinephrine.  The spinal needle was inserted and advanced in a trajectory view down to the target.   After contact with periosteum and negative aspirate for blood and CSF, correct placement without intravascular or epidural spread was confirmed by injecting 0.5 ml. of Isovue-250.  A spot radiograph was obtained of this image.    Next, a 0.5 ml. volume of the injectate described above was injected. The needle was then redirected to the other facet joint nerves mentioned above if needed.  Prior to the procedure, the patient was given a Pain Diary which was completed for baseline measurements.  After the procedure, the patient rated their pain every 30 minutes and will continue rating at this frequency for a total of 5 hours.  The patient has been asked to complete the Diary and return to [place] by mail, fax or hand delivered as soon as possible.   Additional Comments:  The patient tolerated the procedure well Dressing: 2 x 2 sterile gauze and Band-Aid     Post-procedure details: Patient was observed during the procedure. Post-procedure instructions were reviewed.  Patient left the clinic in stable condition.     Clinical History: MRI LUMBAR SPINE WITHOUT CONTRAST  TECHNIQUE: Multiplanar, multisequence MR imaging of the lumbar spine was performed. No intravenous contrast was administered.  COMPARISON:  None.  FINDINGS: Segmentation:  Normal  Alignment:  Mild retrolisthesis L3-4.  Mild anterolisthesis L4-5.  Vertebrae:  Normal bone marrow.  Negative for fracture or mass.  Conus medullaris and cauda equina: Conus extends to the L1-2 level. Conus and cauda equina appear normal.  Paraspinal and other soft tissues: Negative for paraspinous mass or fluid collection  Disc levels:  L1-2: Mild disc bulging.  Negative for stenosis  L2-3: Mild disc bulging and mild facet degeneration. Negative for stenosis.  L3-4: Small disc protrusion on the left involving the foramen and extraforaminal space. Mild subarticular stenosis. No significant spinal stenosis  L4-5: Mild anterolisthesis. Broad-based left-sided disc protrusion with subarticular stenosis. Moderate subarticular and foraminal stenosis bilaterally. Moderate spinal stenosis. Moderate facet degeneration bilaterally  L5-S1: Small central and right-sided disc protrusion. Expected impingement of the right S1 nerve root. Bilateral facet hypertrophy.  IMPRESSION: Small left-sided disc protrusion L3-4  Grade 1 anterolisthesis L4-5 with disc and facet degeneration causing moderate spinal stenosis. Broad-based left-sided disc protrusion. Moderate subarticular foraminal stenosis bilaterally  Central and right-sided disc protrusion L5-S1 with expected impingement right S1 nerve root.   Electronically Signed   By: Franchot Gallo M.D.   On: 09/18/2018 09:10     Objective:  VS:  HT:    WT:   BMI:     BP:(!) 151/100  HR:67bpm  TEMP: ( )  RESP:   Physical Exam Constitutional:      General: He is not in acute distress.    Appearance: Normal appearance. He is not ill-appearing.  HENT:     Head: Normocephalic and atraumatic.     Right Ear: External ear normal.     Left Ear: External ear normal.  Eyes:     Extraocular Movements: Extraocular movements intact.  Cardiovascular:     Rate and Rhythm: Normal rate.     Pulses: Normal pulses.  Abdominal:     General: There is no distension.     Palpations: Abdomen is soft.  Musculoskeletal:        General: No tenderness or signs of injury.     Right lower leg: No edema.     Left lower leg: No edema.     Comments: Patient has good distal strength without clonus. Patient somewhat slow to rise from a seated position to full extension.  There is concordant low back pain with facet loading and lumbar spine extension rotation.  There are no definitive trigger points but the patient is somewhat tender across the lower back and PSIS.  There is no pain with hip rotation.   Skin:    Findings: No erythema or rash.  Neurological:     General: No focal deficit present.     Mental Status: He is alert and oriented to person, place, and time.  Sensory: No sensory deficit.     Motor: No weakness or abnormal muscle tone.     Coordination: Coordination normal.  Psychiatric:        Mood and Affect: Mood normal.        Behavior: Behavior normal.      Imaging: XR C-ARM NO REPORT  Result Date: 08/09/2020 Please see Notes tab for imaging impression.

## 2020-08-09 NOTE — Procedures (Signed)
Lumbar Diagnostic Facet Joint Nerve Block with Fluoroscopic Guidance   Patient: Joseph Davila      Date of Birth: 21-Jul-1961 MRN: [id number] PCP: Kristen Loader, FNP      Visit Date: 08/09/2020   Universal Protocol:    Date/Time: 11/08/218:20 AM  Consent Given By: the patient  Position: PRONE  Additional Comments: Vital signs were monitored before and after the procedure. Patient was prepped and draped in the usual sterile fashion. The correct patient, procedure, and site was verified.   Injection Procedure Details:   Procedure diagnoses:  1. Spondylosis without myelopathy or radiculopathy, lumbar region      Meds Administered:  Meds ordered this encounter  Medications  . bupivacaine (MARCAINE) 0.5 % (with pres) injection 3 mL     Laterality: Bilateral  Location/Site:  L4-L5 L5-S1  Needle:5.0 in., 25 ga.  Short bevel or Quincke spinal needle  Needle Placement: Oblique pedical  Findings:   -Comments: There was excellent flow of contrast along the articular pillars without intravascular flow.  Procedure Details: The fluoroscope beam is vertically oriented in AP and then obliqued 15 to 20 degrees to the ipsilateral side of the desired nerve to achieve the "Scotty dog" appearance.  The skin over the target area of the junction of the superior articulating process and the transverse process (sacral ala if blocking the L5 dorsal rami) was locally anesthetized with a 1 ml volume of 1% Lidocaine without Epinephrine.  The spinal needle was inserted and advanced in a trajectory view down to the target.   After contact with periosteum and negative aspirate for blood and CSF, correct placement without intravascular or epidural spread was confirmed by injecting 0.5 ml. of Isovue-250.  A spot radiograph was obtained of this image.    Next, a 0.5 ml. volume of the injectate described above was injected. The needle was then redirected to the other facet joint nerves mentioned above  if needed.  Prior to the procedure, the patient was given a Pain Diary which was completed for baseline measurements.  After the procedure, the patient rated their pain every 30 minutes and will continue rating at this frequency for a total of 5 hours.  The patient has been asked to complete the Diary and return to [place] by mail, fax or hand delivered as soon as possible.   Additional Comments:  The patient tolerated the procedure well Dressing: 2 x 2 sterile gauze and Band-Aid    Post-procedure details: Patient was observed during the procedure. Post-procedure instructions were reviewed.  Patient left the clinic in stable condition.

## 2020-08-09 NOTE — Procedures (Signed)
Lumbar Diagnostic Facet Joint Nerve Block with Fluoroscopic Guidance   Patient: Joseph Davila      Date of Birth: 1960-12-16 MRN: [id number] PCP: Kristen Loader, FNP      Visit Date: 07/20/2020   Universal Protocol:    Date/Time: 11/08/215:29 AM  Consent Given By: the patient  Position: PRONE  Additional Comments: Vital signs were monitored before and after the procedure. Patient was prepped and draped in the usual sterile fashion. The correct patient, procedure, and site was verified.   Injection Procedure Details:   Procedure diagnoses:  1. Spondylolisthesis of lumbar region   2. Chronic bilateral low back pain with bilateral sciatica      Meds Administered:  Meds ordered this encounter  Medications  . bupivacaine (MARCAINE) 0.5 % (with pres) injection 3 mL  . methylPREDNISolone acetate (DEPO-MEDROL) injection 80 mg     Laterality: Bilateral  Location/Site:  L4-L5 L5-S1  Needle size: 22 ga.  Needle type:spinal  Needle Placement: Oblique pedical  Findings:   -Comments: There was excellent flow of contrast along the articular pillars without intravascular flow.  Procedure Details: The fluoroscope beam is vertically oriented in AP and then obliqued 15 to 20 degrees to the ipsilateral side of the desired nerve to achieve the "Scotty dog" appearance.  The skin over the target area of the junction of the superior articulating process and the transverse process (sacral ala if blocking the L5 dorsal rami) was locally anesthetized with a 1 ml volume of 1% Lidocaine without Epinephrine.  The spinal needle was inserted and advanced in a trajectory view down to the target.   After contact with periosteum and negative aspirate for blood and CSF, correct placement without intravascular or epidural spread was confirmed by injecting 0.5 ml. of Isovue-250.  A spot radiograph was obtained of this image.    Next, a 0.5 ml. volume of the injectate described above was injected.  The needle was then redirected to the other facet joint nerves mentioned above if needed.  Prior to the procedure, the patient was given a Pain Diary which was completed for baseline measurements.  After the procedure, the patient rated their pain every 30 minutes and will continue rating at this frequency for a total of 5 hours.  The patient has been asked to complete the Diary and return to [place] by mail, fax or hand delivered as soon as possible.   Additional Comments:  The patient tolerated the procedure well Dressing: 2 x 2 sterile gauze and Band-Aid    Post-procedure details: Patient was observed during the procedure. Post-procedure instructions were reviewed.  Patient left the clinic in stable condition.

## 2020-08-10 ENCOUNTER — Telehealth: Payer: Self-pay | Admitting: Physical Medicine and Rehabilitation

## 2020-08-10 ENCOUNTER — Telehealth: Payer: Self-pay

## 2020-08-10 NOTE — Telephone Encounter (Signed)
Contact pt through e-mail, I have received his pain dairy.

## 2020-08-10 NOTE — Telephone Encounter (Signed)
Pt called stating he emailed his pain diary to shena and wanted to make sure she received it; pt would like a CB to verify this  [phone]

## 2020-08-10 NOTE — Telephone Encounter (Signed)
Called pt back and comfirm he dairy and insurance was approve. Pt was sch on 12/14.

## 2020-08-10 NOTE — Telephone Encounter (Signed)
Patient called in needing assistance with his pain diary ,, asked to give him a call to help with submitting instead of email

## 2020-08-19 ENCOUNTER — Ambulatory Visit: Payer: 59 | Admitting: Physical Medicine and Rehabilitation

## 2020-09-14 ENCOUNTER — Ambulatory Visit (INDEPENDENT_AMBULATORY_CARE_PROVIDER_SITE_OTHER): Payer: 59 | Admitting: Physical Medicine and Rehabilitation

## 2020-09-14 ENCOUNTER — Ambulatory Visit: Payer: Self-pay

## 2020-09-14 ENCOUNTER — Other Ambulatory Visit: Payer: Self-pay

## 2020-09-14 ENCOUNTER — Encounter: Payer: Self-pay | Admitting: Physical Medicine and Rehabilitation

## 2020-09-14 VITALS — BP 155/103 | HR 65

## 2020-09-14 DIAGNOSIS — M47816 Spondylosis without myelopathy or radiculopathy, lumbar region: Secondary | ICD-10-CM | POA: Diagnosis not present

## 2020-09-14 MED ORDER — METHYLPREDNISOLONE ACETATE 80 MG/ML IJ SUSP
80.0000 mg | Freq: Once | INTRAMUSCULAR | Status: AC
  Administered 2020-09-14: 80 mg

## 2020-09-14 NOTE — Progress Notes (Signed)
  Numeric Pain Rating Scale and Functional Assessment Average Pain 5   In the last MONTH (on 0-10 scale) has pain interfered with the following?  1. General activity like being  able to carry out your everyday physical activities such as walking, climbing stairs, carrying groceries, or moving a chair?  Rating(8)   +Driver, -BT, -Dye Allergies.  

## 2020-09-15 NOTE — Procedures (Signed)
Lumbar Facet Joint Nerve Denervation  Patient: Joseph Davila      Date of Birth: October 22, 1960 MRN: [id number] PCP: Kristen Loader, FNP      Visit Date: 09/14/2020   Universal Protocol:    Date/Time: 12/15/215:57 AM  Consent Given By: the patient  Position: PRONE  Additional Comments: Vital signs were monitored before and after the procedure. Patient was prepped and draped in the usual sterile fashion. The correct patient, procedure, and site was verified.   Injection Procedure Details:   Procedure diagnoses:  1. Spondylosis without myelopathy or radiculopathy, lumbar region      Meds Administered:  Meds ordered this encounter  Medications  . methylPREDNISolone acetate (DEPO-MEDROL) injection 80 mg     Laterality: Bilateral  Location/Site:  L4-L5, L3 and L4 medial branches and L5-S1, L4 medial branch and L5 dorsal ramus  Needle: 18 ga.,  30mm active tip RF Cannula  Needle Placement: Along juncture of superior articular process and transverse pocess  Findings:  -Comments:  Procedure Details: For each desired target nerve, the corresponding transverse process (sacral ala for the L5 dorsal rami) was identified and the fluoroscope was positioned to square off the endplates of the corresponding vertebral body to achieve a true AP midline view.  The beam was then obliqued 15 to 20 degrees and caudally tilted 15 to 20 degrees to line up a trajectory along the target nerves. The skin over the target of the junction of superior articulating process and transverse process (sacral ala for the L5 dorsal rami) was infiltrated with 15ml of 1% Lidocaine without Epinephrine.  The 18 gauge 23mm active tip outer cannula was advanced in trajectory view to the target.  This procedure was repeated for each target nerve.  Then, for all levels, the outer cannula placement was fine-tuned and the position was then confirmed with bi-planar imaging.    Test stimulation was done both at sensory and  motor levels to ensure there was no radicular stimulation. The target tissues were then infiltrated with 1 ml of 1% Lidocaine without Epinephrine. Subsequently, a percutaneous neurotomy was carried out for 90 seconds at 80 degrees Celsius.  After the completion of the lesion, 1 ml of injectate was delivered. It was then repeated for each facet joint nerve mentioned above. Appropriate radiographs were obtained to verify the probe placement during the neurotomy.   Additional Comments:  No complications occurred Dressing: 2 x 2 sterile gauze and Band-Aid    Post-procedure details: Patient was observed during the procedure. Post-procedure instructions were reviewed.  Patient left the clinic in stable condition.

## 2020-09-15 NOTE — Progress Notes (Signed)
Rasool Rommel - 59 y.o. male MRN [id number]  Date of birth: October 28, 1960  Office Visit Note: Visit Date: 09/14/2020 PCP: Kristen Loader, FNP Referred by: Kristen Loader, FNP  Subjective: Chief Complaint  Patient presents with  . Lower Back - Pain  . Left Leg - Pain  . Right Leg - Pain   HPI:  Cleto Claggett is a 59 y.o. male who comes in today for planned radiofrequency ablation of the Bilateral L4-L5 and L5-S1 Lumbar facet joints. This would be ablation of the corresponding medial branches and/or dorsal rami.  Patient has had double diagnostic blocks with more than 50% relief.  These are documented on pain diary.  They have had chronic back pain for quite some time, more than 3 months, which has been an ongoing situation with recalcitrant axial back pain.  They have no radicular pain.  Their axial pain is worse with standing and ambulating and on exam today with facet loading.  They have had physical therapy as well as home exercise program.  The imaging noted in the chart below indicated facet pathology. Accordingly they meet all the criteria and qualification for for radiofrequency ablation and we are going to complete this today hopefully for more longer term relief as part of comprehensive management program.  He again reports axial low back pain with some referral into the lateral hips but nothing down the legs past the knees.  No paresthesias.  Prior notes can be fully reviewed.  He gets a lot of pain going from sit to stand and with prolonged standing.  He does get some relief with ongoing use of meloxicam or ibuprofen but he does not want to stay on that forever.  ROS Otherwise per HPI.  Assessment & Plan: Visit Diagnoses:    ICD-10-CM   1. Spondylosis without myelopathy or radiculopathy, lumbar region  M47.816 XR C-ARM NO REPORT    Radiofrequency,Lumbar    methylPREDNISolone acetate (DEPO-MEDROL) injection 80 mg    Plan: No additional findings.   Meds & Orders:  Meds ordered  this encounter  Medications  . methylPREDNISolone acetate (DEPO-MEDROL) injection 80 mg    Orders Placed This Encounter  Procedures  . Radiofrequency,Lumbar  . XR C-ARM NO REPORT    Follow-up: Return if symptoms worsen or fail to improve.   Procedures: No procedures performed  Lumbar Facet Joint Nerve Denervation  Patient: Randale Carvalho      Date of Birth: 25-Dec-1960 MRN: [id number] PCP: Kristen Loader, FNP      Visit Date: 09/14/2020   Universal Protocol:    Date/Time: 12/15/215:57 AM  Consent Given By: the patient  Position: PRONE  Additional Comments: Vital signs were monitored before and after the procedure. Patient was prepped and draped in the usual sterile fashion. The correct patient, procedure, and site was verified.   Injection Procedure Details:   Procedure diagnoses:  1. Spondylosis without myelopathy or radiculopathy, lumbar region      Meds Administered:  Meds ordered this encounter  Medications  . methylPREDNISolone acetate (DEPO-MEDROL) injection 80 mg     Laterality: Bilateral  Location/Site:  L4-L5, L3 and L4 medial branches and L5-S1, L4 medial branch and L5 dorsal ramus  Needle: 18 ga.,  69mm active tip RF Cannula  Needle Placement: Along juncture of superior articular process and transverse pocess  Findings:  -Comments:  Procedure Details: For each desired target nerve, the corresponding transverse process (sacral ala for the L5 dorsal rami) was identified and the fluoroscope was positioned  to square off the endplates of the corresponding vertebral body to achieve a true AP midline view.  The beam was then obliqued 15 to 20 degrees and caudally tilted 15 to 20 degrees to line up a trajectory along the target nerves. The skin over the target of the junction of superior articulating process and transverse process (sacral ala for the L5 dorsal rami) was infiltrated with 53ml of 1% Lidocaine without Epinephrine.  The 18 gauge 53mm active tip  outer cannula was advanced in trajectory view to the target.  This procedure was repeated for each target nerve.  Then, for all levels, the outer cannula placement was fine-tuned and the position was then confirmed with bi-planar imaging.    Test stimulation was done both at sensory and motor levels to ensure there was no radicular stimulation. The target tissues were then infiltrated with 1 ml of 1% Lidocaine without Epinephrine. Subsequently, a percutaneous neurotomy was carried out for 90 seconds at 80 degrees Celsius.  After the completion of the lesion, 1 ml of injectate was delivered. It was then repeated for each facet joint nerve mentioned above. Appropriate radiographs were obtained to verify the probe placement during the neurotomy.   Additional Comments:  No complications occurred Dressing: 2 x 2 sterile gauze and Band-Aid    Post-procedure details: Patient was observed during the procedure. Post-procedure instructions were reviewed.  Patient left the clinic in stable condition.        Clinical History: MRI LUMBAR SPINE WITHOUT CONTRAST  TECHNIQUE: Multiplanar, multisequence MR imaging of the lumbar spine was performed. No intravenous contrast was administered.  COMPARISON:  None.  FINDINGS: Segmentation:  Normal  Alignment:  Mild retrolisthesis L3-4.  Mild anterolisthesis L4-5.  Vertebrae:  Normal bone marrow.  Negative for fracture or mass.  Conus medullaris and cauda equina: Conus extends to the L1-2 level. Conus and cauda equina appear normal.  Paraspinal and other soft tissues: Negative for paraspinous mass or fluid collection  Disc levels:  L1-2: Mild disc bulging.  Negative for stenosis  L2-3: Mild disc bulging and mild facet degeneration. Negative for stenosis.  L3-4: Small disc protrusion on the left involving the foramen and extraforaminal space. Mild subarticular stenosis. No significant spinal stenosis  L4-5: Mild  anterolisthesis. Broad-based left-sided disc protrusion with subarticular stenosis. Moderate subarticular and foraminal stenosis bilaterally. Moderate spinal stenosis. Moderate facet degeneration bilaterally  L5-S1: Small central and right-sided disc protrusion. Expected impingement of the right S1 nerve root. Bilateral facet hypertrophy.  IMPRESSION: Small left-sided disc protrusion L3-4  Grade 1 anterolisthesis L4-5 with disc and facet degeneration causing moderate spinal stenosis. Broad-based left-sided disc protrusion. Moderate subarticular foraminal stenosis bilaterally  Central and right-sided disc protrusion L5-S1 with expected impingement right S1 nerve root.   Electronically Signed   By: Franchot Gallo M.D.   On: 09/18/2018 09:10     Objective:  VS:  HT:    WT:   BMI:     BP:(!) 155/103  HR:65bpm  TEMP: ( )  RESP:  Physical Exam Constitutional:      General: He is not in acute distress.    Appearance: Normal appearance. He is not ill-appearing.  HENT:     Head: Normocephalic and atraumatic.     Right Ear: External ear normal.     Left Ear: External ear normal.  Eyes:     Extraocular Movements: Extraocular movements intact.  Cardiovascular:     Rate and Rhythm: Normal rate.     Pulses: Normal pulses.  Abdominal:     General: There is no distension.     Palpations: Abdomen is soft.  Musculoskeletal:        General: No tenderness or signs of injury.     Right lower leg: No edema.     Left lower leg: No edema.     Comments: Patient has good distal strength without clonus. Patient somewhat slow to rise from a seated position to full extension.  There is concordant low back pain with facet loading and lumbar spine extension rotation.  There are no definitive trigger points but the patient is somewhat tender across the lower back and PSIS.  There is no pain with hip rotation.   Skin:    Findings: No erythema or rash.  Neurological:     General: No  focal deficit present.     Mental Status: He is alert and oriented to person, place, and time.     Sensory: No sensory deficit.     Motor: No weakness or abnormal muscle tone.     Coordination: Coordination normal.  Psychiatric:        Mood and Affect: Mood normal.        Behavior: Behavior normal.      Imaging: XR C-ARM NO REPORT  Result Date: 09/14/2020 Please see Notes tab for imaging impression.

## 2021-05-26 ENCOUNTER — Other Ambulatory Visit: Payer: Self-pay | Admitting: Physical Medicine and Rehabilitation

## 2021-05-26 NOTE — Telephone Encounter (Signed)
Please advise 

## 2021-06-01 ENCOUNTER — Encounter: Payer: Self-pay | Admitting: Adult Health

## 2021-06-01 DIAGNOSIS — M545 Low back pain, unspecified: Secondary | ICD-10-CM

## 2021-06-01 DIAGNOSIS — M5410 Radiculopathy, site unspecified: Secondary | ICD-10-CM

## 2021-06-10 ENCOUNTER — Ambulatory Visit (INDEPENDENT_AMBULATORY_CARE_PROVIDER_SITE_OTHER)
Admission: RE | Admit: 2021-06-10 | Discharge: 2021-06-10 | Disposition: A | Discharge status: Home / Self Care | Payer: PRIVATE HEALTH INSURANCE | Source: Ambulatory Visit | Attending: Adult Health | Admitting: Adult Health

## 2021-06-10 DIAGNOSIS — M545 Low back pain, unspecified: Secondary | ICD-10-CM

## 2021-06-10 DIAGNOSIS — M5410 Radiculopathy, site unspecified: Secondary | ICD-10-CM

## 2021-06-10 MED ORDER — GADOTERATE MEGLUMINE 10 MMOL/20ML IV SOLN
20.0000 mL | Freq: Once | INTRAVENOUS | Status: AC | PRN
Start: 2021-06-10 — End: 2021-06-10
  Administered 2021-06-10: 12:00:00 10 mmol via INTRAVENOUS

## 2021-07-22 ENCOUNTER — Other Ambulatory Visit: Payer: Self-pay | Admitting: Physical Medicine and Rehabilitation

## 2021-07-26 ENCOUNTER — Other Ambulatory Visit: Payer: Self-pay | Admitting: Physical Medicine and Rehabilitation

## 2021-07-26 MED ORDER — MELOXICAM 15 MG PO TABS: ORAL_TABLET | ORAL | 1 refills | Status: AC

## 2021-07-26 NOTE — Telephone Encounter (Signed)
Please advise 

## 2021-07-26 NOTE — Progress Notes (Signed)
Refill request complete 

## 2021-09-12 ENCOUNTER — Ambulatory Visit (INDEPENDENT_AMBULATORY_CARE_PROVIDER_SITE_OTHER): Payer: PRIVATE HEALTH INSURANCE

## 2021-10-20 ENCOUNTER — Ambulatory Visit (INDEPENDENT_AMBULATORY_CARE_PROVIDER_SITE_OTHER): Payer: BC Managed Care – PPO

## 2021-12-29 ENCOUNTER — Ambulatory Visit (INDEPENDENT_AMBULATORY_CARE_PROVIDER_SITE_OTHER): Payer: Self-pay | Admitting: Audiologist-Hearing Aid Fitter

## 2021-12-30 ENCOUNTER — Ambulatory Visit (INDEPENDENT_AMBULATORY_CARE_PROVIDER_SITE_OTHER): Payer: Self-pay | Admitting: Audiologist-Hearing Aid Fitter

## 2022-01-26 ENCOUNTER — Ambulatory Visit (INDEPENDENT_AMBULATORY_CARE_PROVIDER_SITE_OTHER): Payer: BC Managed Care – PPO | Admitting: Audiologist-Hearing Aid Fitter

## 2022-01-26 ENCOUNTER — Encounter (INDEPENDENT_AMBULATORY_CARE_PROVIDER_SITE_OTHER): Payer: Self-pay | Admitting: Audiologist-Hearing Aid Fitter

## 2022-01-26 DIAGNOSIS — H903 Sensorineural hearing loss, bilateral: Secondary | ICD-10-CM

## 2022-01-26 NOTE — Progress Notes (Signed)
[facility]  [facility]  [address]  [place] [address]    [facility]iological [facility]      Date of evaluation:  01/26/2022, 7:31 AM    Patient Jeffrey Shields, is a 61 y.o. male seen for initial audiologic evaluation with a primary c/o of hearing loss longstanding getting worse.  Patient reports a family history of hearing loss.  Patient is having trouble hearing at work.  Patient tried [employer]argo hearing aids and they did not fit properly.  Patient denies tinnitus.          For general health please review patient's medical record.    Objective:     Otoscopy:     Right  Ear: Visualized a clear ear canal  Left  Ear:  Visualized a clear ear canal        Pure tone audiometry revealed: (WNL=25 dB or less)   *=masked        Transducer: [employer] phones    SRT = 30dB HL in the right ear; 30dB HL in the left ear  Word Recognition = 100% in the right ear; 100% in the left ear         Assessment:     Patient was counseled today regarding audiometric test results.  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   Word recognition scores are Symmetrical  (less than 16-20% discrepancy)  No audiogram is available for comparison at this time.  Patient is a candidate for binaural amplification.  Patient voiced understanding of today's test results and recommendations and is agreeable with plan of care.    Treatment Plan:  1.  Follow up with ENT  2.  Test hearing annually to monitor  3.  Use hearing protection around loud sounds  4.  Recommend hearing aids

## 2022-04-12 ENCOUNTER — Ambulatory Visit: Payer: BC Managed Care – PPO | Attending: Adult Health

## 2022-04-12 DIAGNOSIS — M48061 Spinal stenosis, lumbar region without neurogenic claudication: Secondary | ICD-10-CM | POA: Insufficient documentation

## 2022-04-26 ENCOUNTER — Ambulatory Visit: Payer: BC Managed Care – PPO

## 2022-05-02 ENCOUNTER — Ambulatory Visit: Payer: BC Managed Care – PPO | Attending: Adult Health

## 2022-05-02 ENCOUNTER — Ambulatory Visit: Payer: BC Managed Care – PPO

## 2022-05-02 DIAGNOSIS — M48061 Spinal stenosis, lumbar region without neurogenic claudication: Secondary | ICD-10-CM | POA: Insufficient documentation

## 2022-06-02 ENCOUNTER — Ambulatory Visit: Payer: BC Managed Care – PPO

## 2022-06-02 ENCOUNTER — Ambulatory Visit: Payer: BC Managed Care – PPO | Attending: Adult Health

## 2022-06-02 DIAGNOSIS — M48061 Spinal stenosis, lumbar region without neurogenic claudication: Secondary | ICD-10-CM | POA: Insufficient documentation

## 2022-07-02 ENCOUNTER — Ambulatory Visit: Payer: BC Managed Care – PPO

## 2022-07-06 ENCOUNTER — Ambulatory Visit (INDEPENDENT_AMBULATORY_CARE_PROVIDER_SITE_OTHER): Payer: BC Managed Care – PPO | Admitting: Physical Medicine & Rehabilitation

## 2022-08-23 ENCOUNTER — Ambulatory Visit (INDEPENDENT_AMBULATORY_CARE_PROVIDER_SITE_OTHER): Payer: BC Managed Care – PPO | Admitting: Physical Medicine & Rehabilitation
